# Patient Record
Sex: Female | Born: 1950
Health system: Southern US, Community
[De-identification: ages and names within clinical notes are randomized; demographics above are authoritative.]

## PROBLEM LIST (undated history)

## (undated) DIAGNOSIS — I10 Essential (primary) hypertension: Secondary | ICD-10-CM

## (undated) DIAGNOSIS — M1990 Unspecified osteoarthritis, unspecified site: Secondary | ICD-10-CM

## (undated) DIAGNOSIS — S2239XA Fracture of one rib, unspecified side, initial encounter for closed fracture: Secondary | ICD-10-CM

## (undated) DIAGNOSIS — S22009A Unspecified fracture of unspecified thoracic vertebra, initial encounter for closed fracture: Secondary | ICD-10-CM

## (undated) DIAGNOSIS — I609 Nontraumatic subarachnoid hemorrhage, unspecified: Secondary | ICD-10-CM

## (undated) DIAGNOSIS — T7840XA Allergy, unspecified, initial encounter: Secondary | ICD-10-CM

## (undated) DIAGNOSIS — E785 Hyperlipidemia, unspecified: Secondary | ICD-10-CM

## (undated) DIAGNOSIS — S06309A Unspecified focal traumatic brain injury with loss of consciousness of unspecified duration, initial encounter: Secondary | ICD-10-CM

## (undated) DIAGNOSIS — H04123 Dry eye syndrome of bilateral lacrimal glands: Secondary | ICD-10-CM

## (undated) DIAGNOSIS — S83419A Sprain of medial collateral ligament of unspecified knee, initial encounter: Secondary | ICD-10-CM

## (undated) DIAGNOSIS — D72829 Elevated white blood cell count, unspecified: Secondary | ICD-10-CM

## (undated) DIAGNOSIS — C801 Malignant (primary) neoplasm, unspecified: Secondary | ICD-10-CM

## (undated) DIAGNOSIS — S42123A Displaced fracture of acromial process, unspecified shoulder, initial encounter for closed fracture: Secondary | ICD-10-CM

## (undated) DIAGNOSIS — S36039A Unspecified laceration of spleen, initial encounter: Secondary | ICD-10-CM

## (undated) DIAGNOSIS — R011 Cardiac murmur, unspecified: Secondary | ICD-10-CM

## (undated) DIAGNOSIS — S300XXA Contusion of lower back and pelvis, initial encounter: Secondary | ICD-10-CM

## (undated) DIAGNOSIS — D62 Acute posthemorrhagic anemia: Secondary | ICD-10-CM

## (undated) DIAGNOSIS — S129XXA Fracture of neck, unspecified, initial encounter: Secondary | ICD-10-CM

## (undated) DIAGNOSIS — R188 Other ascites: Secondary | ICD-10-CM

## (undated) DIAGNOSIS — E876 Hypokalemia: Secondary | ICD-10-CM

## (undated) HISTORY — DX: Cardiac murmur, unspecified: R01.1

## (undated) HISTORY — PX: TUMOR EXCISION: SHX421

## (undated) HISTORY — DX: Dry eye syndrome of bilateral lacrimal glands: H04.123

## (undated) HISTORY — DX: Elevated white blood cell count, unspecified: D72.829

## (undated) HISTORY — DX: Allergy, unspecified, initial encounter: T78.40XA

## (undated) HISTORY — DX: Fracture of one rib, unspecified side, initial encounter for closed fracture: S22.39XA

## (undated) HISTORY — PX: TONSILLECTOMY: SUR1361

## (undated) HISTORY — DX: Sprain of medial collateral ligament of unspecified knee, initial encounter: S83.419A

## (undated) HISTORY — PX: BREAST BIOPSY: SHX20

## (undated) HISTORY — DX: Acute posthemorrhagic anemia: D62

## (undated) HISTORY — DX: Other ascites: R18.8

## (undated) HISTORY — DX: Unspecified laceration of spleen, initial encounter: S36.039A

## (undated) HISTORY — DX: Hypokalemia: E87.6

## (undated) HISTORY — DX: Displaced fracture of acromial process, unspecified shoulder, initial encounter for closed fracture: S42.123A

## (undated) HISTORY — DX: Fracture of neck, unspecified, initial encounter: S12.9XXA

## (undated) HISTORY — PX: NASAL SINUS SURGERY: SHX719

## (undated) HISTORY — DX: Contusion of lower back and pelvis, initial encounter: S30.0XXA

## (undated) HISTORY — PX: APPENDECTOMY: SHX54

## (undated) HISTORY — DX: Malignant (primary) neoplasm, unspecified: C80.1

## (undated) HISTORY — DX: Unspecified fracture of unspecified thoracic vertebra, initial encounter for closed fracture: S22.009A

## (undated) HISTORY — DX: Unspecified focal traumatic brain injury with loss of consciousness of unspecified duration, initial encounter: S06.309A

## (undated) HISTORY — PX: OTHER SURGICAL HISTORY: SHX169

## (undated) HISTORY — DX: Essential (primary) hypertension: I10

## (undated) HISTORY — DX: Unspecified osteoarthritis, unspecified site: M19.90

## (undated) HISTORY — DX: Nontraumatic subarachnoid hemorrhage, unspecified: I60.9

## (undated) HISTORY — DX: Hyperlipidemia, unspecified: E78.5

---

## 1981-05-30 HISTORY — PX: ABDOMINAL HYSTERECTOMY: SHX81

## 1999-09-28 ENCOUNTER — Other Ambulatory Visit: Admission: RE | Admit: 1999-09-28 | Discharge: 1999-09-28 | Payer: Self-pay | Admitting: Obstetrics and Gynecology

## 1999-10-16 ENCOUNTER — Ambulatory Visit (HOSPITAL_COMMUNITY): Admission: RE | Admit: 1999-10-16 | Discharge: 1999-10-16 | Payer: Self-pay | Admitting: Family Medicine

## 1999-10-16 ENCOUNTER — Encounter: Payer: Self-pay | Admitting: Family Medicine

## 2000-10-12 ENCOUNTER — Other Ambulatory Visit: Admission: RE | Admit: 2000-10-12 | Discharge: 2000-10-12 | Payer: Self-pay | Admitting: Obstetrics and Gynecology

## 2001-10-04 ENCOUNTER — Other Ambulatory Visit: Admission: RE | Admit: 2001-10-04 | Discharge: 2001-10-04 | Payer: Self-pay | Admitting: Gynecology

## 2002-10-07 ENCOUNTER — Other Ambulatory Visit: Admission: RE | Admit: 2002-10-07 | Discharge: 2002-10-07 | Payer: Self-pay | Admitting: Gynecology

## 2003-10-13 ENCOUNTER — Other Ambulatory Visit: Admission: RE | Admit: 2003-10-13 | Discharge: 2003-10-13 | Payer: Self-pay | Admitting: Gynecology

## 2004-10-12 ENCOUNTER — Other Ambulatory Visit: Admission: RE | Admit: 2004-10-12 | Discharge: 2004-10-12 | Payer: Self-pay | Admitting: Gynecology

## 2005-11-01 ENCOUNTER — Other Ambulatory Visit: Admission: RE | Admit: 2005-11-01 | Discharge: 2005-11-01 | Payer: Self-pay | Admitting: Gynecology

## 2006-11-02 ENCOUNTER — Other Ambulatory Visit: Admission: RE | Admit: 2006-11-02 | Discharge: 2006-11-02 | Payer: Self-pay | Admitting: Gynecology

## 2009-04-16 ENCOUNTER — Encounter (INDEPENDENT_AMBULATORY_CARE_PROVIDER_SITE_OTHER): Payer: Self-pay | Admitting: *Deleted

## 2009-05-08 ENCOUNTER — Encounter (INDEPENDENT_AMBULATORY_CARE_PROVIDER_SITE_OTHER): Payer: Self-pay | Admitting: *Deleted

## 2009-05-11 ENCOUNTER — Ambulatory Visit: Payer: Self-pay | Admitting: Gastroenterology

## 2009-05-27 ENCOUNTER — Ambulatory Visit: Payer: Self-pay | Admitting: Gastroenterology

## 2012-12-24 ENCOUNTER — Encounter: Payer: Self-pay | Admitting: Family Medicine

## 2012-12-24 ENCOUNTER — Ambulatory Visit (INDEPENDENT_AMBULATORY_CARE_PROVIDER_SITE_OTHER): Admitting: Family Medicine

## 2012-12-24 VITALS — BP 164/70 | HR 65 | Temp 97.0°F | Ht 63.0 in | Wt 120.0 lb

## 2012-12-24 DIAGNOSIS — J069 Acute upper respiratory infection, unspecified: Secondary | ICD-10-CM

## 2012-12-24 MED ORDER — BENZONATATE 100 MG PO CAPS: 100.0000 mg | ORAL_CAPSULE | Freq: Two times a day (BID) | ORAL | Status: DC | PRN

## 2012-12-24 MED ORDER — AZITHROMYCIN 250 MG PO TABS: ORAL_TABLET | ORAL | Status: DC

## 2012-12-24 NOTE — Progress Notes (Signed)
  Subjective:    Patient ID: Pam Franklin, female    DOB: [birth year], 62 y.o.   MRN: [id number]  HPI This 62 y.o. female presents for evaluation of cough and sore throat for 3 days.  She states She woke up 3 days ago with sore throat and congestion.   Review of Systems C/o cough and sore throat. No chest pain, SOB, HA, dizziness, vision change, N/V, diarrhea, constipation, dysuria, urinary urgency or frequency, myalgias, arthralgias or rash.     Objective:   Physical Exam Vital signs noted  Well developed well nourished female.  HEENT - Head atraumatic Normocephalic                Eyes - PERRLA, Conjuctiva - clear Sclera- Clear EOMI                Ears - EAC's Wnl TM's Wnl Gross Hearing WNL                Nose - Nares patent                 Throat - oropharanx wnl Respiratory - Lungs CTA bilateral Cardiac - RRR S1 and S2 without murmur GI - Abdomen soft Nontender and bowel sounds active x 4       Assessment & Plan:  Acute upper respiratory infections of unspecified site - Plan: azithromycin (ZITHROMAX) 250 MG tablet, benzonatate (TESSALON) 100 MG capsule

## 2012-12-24 NOTE — Patient Instructions (Addendum)

## 2013-01-18 ENCOUNTER — Telehealth: Payer: Self-pay | Admitting: Family Medicine

## 2013-01-18 ENCOUNTER — Ambulatory Visit (INDEPENDENT_AMBULATORY_CARE_PROVIDER_SITE_OTHER): Admitting: Family Medicine

## 2013-01-18 ENCOUNTER — Encounter: Payer: Self-pay | Admitting: Family Medicine

## 2013-01-18 VITALS — BP 183/71 | HR 66 | Temp 97.2°F | Ht 63.0 in | Wt 119.5 lb

## 2013-01-18 DIAGNOSIS — I10 Essential (primary) hypertension: Secondary | ICD-10-CM

## 2013-01-18 DIAGNOSIS — R350 Frequency of micturition: Secondary | ICD-10-CM

## 2013-01-18 DIAGNOSIS — R42 Dizziness and giddiness: Secondary | ICD-10-CM

## 2013-01-18 LAB — POCT URINALYSIS DIPSTICK
Bilirubin, UA: NEGATIVE
Glucose, UA: NEGATIVE
Ketones, UA: NEGATIVE
Leukocytes, UA: NEGATIVE
Nitrite, UA: NEGATIVE
Protein, UA: NEGATIVE
Spec Grav, UA: <=1.005
Urobilinogen, UA: NEGATIVE
pH, UA: 8

## 2013-01-18 LAB — POCT UA - MICROSCOPIC ONLY
Casts, Ur, LPF, POC: NEGATIVE
Crystals, Ur, HPF, POC: NEGATIVE
Epithelial cells, urine per micros: NEGATIVE
Mucus, UA: NEGATIVE
WBC, Ur, HPF, POC: NEGATIVE
Yeast, UA: NEGATIVE

## 2013-01-18 MED ORDER — LISINOPRIL 10 MG PO TABS: 10.0000 mg | ORAL_TABLET | Freq: Every day | ORAL | Status: DC

## 2013-01-18 NOTE — Progress Notes (Signed)
  Subjective:    Patient ID: Pam Franklin, female    DOB: 1951-01-29, 62 y.o.   MRN: [id number]  HPI  This 62 y.o. female presents for evaluation of vertigo sx's, right bicep discomfort, elevated bp, and  Fatigue.  She is having some vertigo sx's when she got up this am.  She was nauseated but this has resolved.  Review of Systems C/o vertigo and fatigue.   No chest pain, SOB, HA, dizziness, vision change, N/V, diarrhea, constipation, dysuria, urinary urgency or frequency, myalgias, arthralgias or rash.  Objective:   Physical Exam Vital signs noted  Well developed well nourished female.  HEENT - Head atraumatic Normocephalic                Eyes - PERRLA, Conjuctiva - clear Sclera- Clear EOM - with nystagmus and c/o vertigo                Ears - EAC's Wnl TM's Wnl Gross Hearing WNL                Nose - Nares patent                 Throat - oropharanx wnl Respiratory - Lungs CTA bilateral Cardiac - RRR S1 and S2 without murmur GI - Abdomen soft Nontender and bowel sounds active x 4 Extremities - No edema. Neuro - Grossly intact. MS - TTP right bicep  EKG - NSR without acute ST-T changes.     Results for orders placed in visit on 01/18/13  POCT URINALYSIS DIPSTICK      Result Value Range   Color, UA strew     Clarity, UA clear     Glucose, UA neg     Bilirubin, UA neg     Ketones, UA neg     Spec Grav, UA <=1.005     Blood, UA trace     pH, UA 8.0     Protein, UA neg     Urobilinogen, UA negative     Nitrite, UA neg     Leukocytes, UA Negative    POCT UA - MICROSCOPIC ONLY      Result Value Range   WBC, Ur, HPF, POC neg     RBC, urine, microscopic occ     Bacteria, U Microscopic mod     Mucus, UA neg     Epithelial cells, urine per micros neg     Crystals, Ur, HPF, POC neg     Casts, Ur, LPF, POC neg     Yeast, UA neg     Assessment & Plan:  Dizziness - Plan: EKG 12-Lead, Thyroid Panel With TSH.  Discussed this is middle ear, she declines Meclizine or  antivert, recommend she use flonase 2 sprays per nostril qd.  She has flonase at home.  Essential hypertension, benign - Plan: lisinopril (PRINIVIL,ZESTRIL) 10 MG tablet, POCT CBC, CMP14+EGFR, Thyroid Panel With TSH, Lipid panel.  Start Lisinopril 10mg  and follow up in 2 weeks.  Urinary frequency - Plan: POCT urinalysis dipstick, POCT UA - Microscopic Only.  UA is negative for UTI.  Right bicep tendonitis - Aleve otc prn  Follow up in 2 weeks.

## 2013-01-18 NOTE — Patient Instructions (Signed)

## 2013-01-18 NOTE — Telephone Encounter (Signed)
appt made

## 2013-01-19 LAB — LIPID PANEL
Chol/HDL Ratio: 2.9 ratio units (ref 0.0–4.4)
Cholesterol, Total: 228 mg/dL — ABNORMAL HIGH (ref 100–199)
HDL: 78 mg/dL (ref >39)
LDL Calculated: 135 mg/dL — ABNORMAL HIGH (ref 0–99)
Triglycerides: 74 mg/dL (ref 0–149)
VLDL Cholesterol Cal: 15 mg/dL (ref 5–40)

## 2013-01-19 LAB — CMP14+EGFR
ALT: 10 IU/L (ref 0–32)
AST: 14 IU/L (ref 0–40)
Albumin/Globulin Ratio: 2 (ref 1.1–2.5)
Albumin: 4.7 g/dL (ref 3.6–4.8)
Alkaline Phosphatase: 87 IU/L (ref 39–117)
BUN/Creatinine Ratio: 17 (ref 11–26)
BUN: 15 mg/dL (ref 8–27)
CO2: 26 mmol/L (ref 18–29)
Calcium: 9.9 mg/dL (ref 8.6–10.2)
Chloride: 101 mmol/L (ref 97–108)
Creatinine, Ser: 0.87 mg/dL (ref 0.57–1.00)
GFR calc Af Amer: 81 mL/min/{1.73_m2} (ref >59)
GFR calc non Af Amer: 71 mL/min/{1.73_m2} (ref >59)
Globulin, Total: 2.3 g/dL (ref 1.5–4.5)
Glucose: 106 mg/dL — ABNORMAL HIGH (ref 65–99)
Potassium: 4.2 mmol/L (ref 3.5–5.2)
Sodium: 141 mmol/L (ref 134–144)
Total Bilirubin: 0.5 mg/dL (ref 0.0–1.2)
Total Protein: 7 g/dL (ref 6.0–8.5)

## 2013-01-19 LAB — THYROID PANEL WITH TSH
Free Thyroxine Index: 2.9 (ref 1.2–4.9)
T3 Uptake Ratio: 30 % (ref 24–39)
T4, Total: 9.7 ug/dL (ref 4.5–12.0)
TSH: 1.15 u[IU]/mL (ref 0.450–4.500)

## 2013-02-01 ENCOUNTER — Ambulatory Visit (INDEPENDENT_AMBULATORY_CARE_PROVIDER_SITE_OTHER): Admitting: Family Medicine

## 2013-02-01 ENCOUNTER — Encounter: Payer: Self-pay | Admitting: Family Medicine

## 2013-02-01 VITALS — BP 137/74 | HR 57 | Temp 96.7°F | Ht 63.0 in | Wt 119.2 lb

## 2013-02-01 DIAGNOSIS — I10 Essential (primary) hypertension: Secondary | ICD-10-CM

## 2013-02-01 DIAGNOSIS — E785 Hyperlipidemia, unspecified: Secondary | ICD-10-CM

## 2013-02-01 HISTORY — DX: Essential (primary) hypertension: I10

## 2013-02-01 HISTORY — DX: Hyperlipidemia, unspecified: E78.5

## 2013-02-01 NOTE — Progress Notes (Signed)
  Subjective:    Patient ID: Pam Franklin, female    DOB: 1951/05/18, 62 y.o.   MRN: [id number]  HPI This 62 y.o. female presents for evaluation of right biceps tendonitis and hypertension. She was started on lisinopril 10mg  po qd and her bp is a lot better.  She is taking Aleve otc and this is helping her right biceps tendonitis.  She had labs last visit And her LDL was 135 and HDL was 78. .   Review of Systems No chest pain, SOB, HA, dizziness, vision change, N/V, diarrhea, constipation, dysuria, urinary urgency or frequency, myalgias, arthralgias or rash.     Objective:   Physical Exam Vital signs noted  Well developed well nourished female.  HEENT - Head atraumatic Normocephalic                Eyes - PERRLA, Conjuctiva - clear Sclera- Clear EOMI                Ears - EAC's Wnl TM's Wnl Gross Hearing WNL                Nose - Nares patent                 Throat - oropharanx wnl Respiratory - Lungs CTA bilateral Cardiac - RRR S1 and S2 without murmur.       Assessment & Plan:  Essential hypertension, benign - Controlled and follow up in 6 months.  Other and unspecified hyperlipidemia - Continue to eat low fat and cholesterol diet and discussed with Patient since her HDL is high this off sets the elevated HDL.

## 2013-02-01 NOTE — Patient Instructions (Signed)

## 2013-02-24 DIAGNOSIS — S06309A Unspecified focal traumatic brain injury with loss of consciousness of unspecified duration, initial encounter: Secondary | ICD-10-CM

## 2013-02-24 DIAGNOSIS — I609 Nontraumatic subarachnoid hemorrhage, unspecified: Secondary | ICD-10-CM | POA: Insufficient documentation

## 2013-02-24 DIAGNOSIS — S0630AA Unspecified focal traumatic brain injury with loss of consciousness status unknown, initial encounter: Secondary | ICD-10-CM

## 2013-02-24 DIAGNOSIS — S22009A Unspecified fracture of unspecified thoracic vertebra, initial encounter for closed fracture: Secondary | ICD-10-CM

## 2013-02-24 DIAGNOSIS — E876 Hypokalemia: Secondary | ICD-10-CM

## 2013-02-24 DIAGNOSIS — D62 Acute posthemorrhagic anemia: Secondary | ICD-10-CM

## 2013-02-24 DIAGNOSIS — S129XXA Fracture of neck, unspecified, initial encounter: Secondary | ICD-10-CM

## 2013-02-24 DIAGNOSIS — D72829 Elevated white blood cell count, unspecified: Secondary | ICD-10-CM

## 2013-02-24 DIAGNOSIS — S2239XA Fracture of one rib, unspecified side, initial encounter for closed fracture: Secondary | ICD-10-CM | POA: Insufficient documentation

## 2013-02-24 HISTORY — DX: Fracture of one rib, unspecified side, initial encounter for closed fracture: S22.39XA

## 2013-02-24 HISTORY — DX: Rider (driver) (passenger) of other motorcycle injured in unspecified traffic accident, initial encounter: V29.99XA

## 2013-02-24 HISTORY — DX: Hypokalemia: E87.6

## 2013-02-24 HISTORY — DX: Elevated white blood cell count, unspecified: D72.829

## 2013-02-24 HISTORY — DX: Nontraumatic subarachnoid hemorrhage, unspecified: I60.9

## 2013-02-24 HISTORY — DX: Acute posthemorrhagic anemia: D62

## 2013-02-24 HISTORY — DX: Unspecified focal traumatic brain injury with loss of consciousness status unknown, initial encounter: S06.30AA

## 2013-02-24 HISTORY — DX: Fracture of neck, unspecified, initial encounter: S12.9XXA

## 2013-02-24 HISTORY — DX: Unspecified fracture of unspecified thoracic vertebra, initial encounter for closed fracture: S22.009A

## 2013-02-24 HISTORY — DX: Unspecified focal traumatic brain injury with loss of consciousness of unspecified duration, initial encounter: S06.309A

## 2013-02-25 DIAGNOSIS — S36039A Unspecified laceration of spleen, initial encounter: Secondary | ICD-10-CM

## 2013-02-25 DIAGNOSIS — S300XXA Contusion of lower back and pelvis, initial encounter: Secondary | ICD-10-CM

## 2013-02-25 DIAGNOSIS — R188 Other ascites: Secondary | ICD-10-CM | POA: Insufficient documentation

## 2013-02-25 HISTORY — DX: Contusion of lower back and pelvis, initial encounter: S30.0XXA

## 2013-02-25 HISTORY — DX: Unspecified laceration of spleen, initial encounter: S36.039A

## 2013-02-25 HISTORY — DX: Other ascites: R18.8

## 2013-03-18 ENCOUNTER — Ambulatory Visit (INDEPENDENT_AMBULATORY_CARE_PROVIDER_SITE_OTHER): Admitting: Family Medicine

## 2013-03-18 ENCOUNTER — Encounter (INDEPENDENT_AMBULATORY_CARE_PROVIDER_SITE_OTHER): Payer: Self-pay

## 2013-03-18 VITALS — BP 155/80 | HR 97 | Temp 97.1°F | Wt 113.4 lb

## 2013-03-18 DIAGNOSIS — R7309 Other abnormal glucose: Secondary | ICD-10-CM

## 2013-03-18 DIAGNOSIS — Z09 Encounter for follow-up examination after completed treatment for conditions other than malignant neoplasm: Secondary | ICD-10-CM

## 2013-03-18 DIAGNOSIS — E875 Hyperkalemia: Secondary | ICD-10-CM

## 2013-03-18 LAB — POCT CBC
Granulocyte percent: 56.3 %G (ref 37–80)
HCT, POC: 38.8 % (ref 37.7–47.9)
Hemoglobin: 12.8 g/dL (ref 12.2–16.2)
Lymph, poc: 2.1 (ref 0.6–3.4)
MCH, POC: 30.4 pg (ref 27–31.2)
MCHC: 33.1 g/dL (ref 31.8–35.4)
MCV: 91.9 fL (ref 80–97)
MPV: 8.1 fL (ref 0–99.8)
POC Granulocyte: 3 (ref 2–6.9)
POC LYMPH PERCENT: 38.4 %L (ref 10–50)
Platelet Count, POC: 489 10*3/uL — AB (ref 142–424)
RBC: 4.2 M/uL (ref 4.04–5.48)
RDW, POC: 16.3 %
WBC: 5.4 10*3/uL (ref 4.6–10.2)
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
## 2013-03-18 NOTE — Patient Instructions (Signed)
Concussion and Brain Injury  A blow or jolt to the head can disrupt the normal function of the brain. This type of brain injury is often called a "concussion" or a "closed head injury." Concussions are usually not life-threatening. Even so, the effects of a concussion can be serious.   CAUSES   A concussion is caused by a blunt blow to the head. The blow might be direct or indirect as described below.  · Direct blow (running into another player during a soccer game, being hit in a fight, or hitting your head on a hard surface).  · Indirect blow (when your head moves rapidly and violently back and forth like in a car crash).  SYMPTOMS   The brain is very complex. Every head injury is different. Some symptoms may appear right away. Other symptoms may not show up for days or weeks after the concussion. The signs of concussion can be hard to notice. Early on, problems may be missed by patients, family members, and caregivers. You may look fine even though you are acting or feeling differently.   These symptoms are usually temporary, but may last for days, weeks, or even longer. Symptoms include:  · Mild headaches that will not go away.  · Having more trouble than usual with:  · Remembering things.  · Paying attention or concentrating.  · Organizing daily tasks.  · Making decisions and solving problems.  · Slowness in thinking, acting, speaking, or reading.  · Getting lost or easily confused.  · Feeling tired all the time or lacking energy (fatigue).  · Feeling drowsy.  · Sleep disturbances.  · Sleeping more than usual.  · Sleeping less than usual.  · Trouble falling asleep.  · Trouble sleeping (insomnia).  · Loss of balance or feeling lightheaded or dizzy.  · Nausea or vomiting.  · Numbness or tingling.  · Increased sensitivity to:  · Sounds.  · Lights.  · Distractions.  Other symptoms might include:  · Vision problems or eyes that tire easily.  · Diminished sense of taste or smell.  · Ringing in the ears.  · Mood  changes such as feeling sad, anxious, or listless.  · Becoming easily irritated or angry for little or no reason.  · Lack of motivation.  DIAGNOSIS   Your caregiver can usually diagnose a concussion or mild brain injury based on your description of your injury and your symptoms.   Your evaluation might include:  · A brain scan to look for signs of injury to the brain. Even if the test shows no injury, you may still have a concussion.  · Blood tests to be sure other problems are not present.  TREATMENT   · People with a concussion need to be examined and evaluated. Most people with concussions are treated in an emergency department, urgent care, or clinic. Some people must stay in the hospital overnight for further treatment.  · Your caregiver will send you home with important instructions to follow. Be sure to carefully follow them.  · Tell your caregiver if you are already taking any medicines (prescription, over-the-counter, or natural remedies), or if you are drinking alcohol or taking illegal drugs. Also, talk with your caregiver if you are taking blood thinners (anticoagulants) or aspirin. These drugs may increase your chances of complications. All of this is important information that may affect treatment.  · Only take over-the-counter or prescription medicines for pain, discomfort, or fever as directed by your caregiver.  PROGNOSIS     How fast people recover from brain injury varies from person to person. Although most people have a good recovery, how quickly they improve depends on many factors. These factors include how severe their concussion was, what part of the brain was injured, their age, and how healthy they were before the concussion.   Because all head injuries are different, so is recovery. Most people with mild injuries recover fully. Recovery can take time. In general, recovery is slower in older persons. Also, persons who have had a concussion in the past or have other medical problems may find  that it takes longer to recover from their current injury. Anxiety and depression may also make it harder to adjust to the symptoms of brain injury.  HOME CARE INSTRUCTIONS   Return to your normal activities slowly, not all at once. You must give your body and brain enough time for recovery.  · Get plenty of sleep at night, and rest during the day. Rest helps the brain to heal.  · Avoid staying up late at night.  · Keep the same bedtime hours on weekends and weekdays.  · Take daytime naps or rest breaks when you feel tired.  · Limit activities that require a lot of thought or concentration (brain or cognitive rest). This includes:  · Homework or job-related work.  · Watching TV.  · Computer work.  · Avoid activities that could lead to a second brain injury, such as contact or recreational sports, until your caregiver says it is okay. Even after your brain injury has healed, you should protect yourself from having another concussion.  · Ask your caregiver when you can return to your normal activities such as driving, bicycling, or operating heavy equipment. Your ability to react may be slower after a brain injury.  · Talk with your caregiver about when you can return to work or school.  · Inform your teachers, school nurse, school counselor, coach, athletic trainer, or work manager about your injury, symptoms, and restrictions. They should be instructed to report:  · Increased problems with attention or concentration.  · Increased problems remembering or learning new information.  · Increased time needed to complete tasks or assignments.  · Increased irritability or decreased ability to cope with stress.  · Increased symptoms.  · Take only those medicines that your caregiver has approved.  · Do not drink alcohol until your caregiver says you are well enough to do so. Alcohol and certain other drugs may slow your recovery and can put you at risk of further injury.  · If it is harder than usual to remember things,  write them down.  · If you are easily distracted, try to do one thing at a time. For example, do not try to watch TV while fixing dinner.  · Talk with family members or close friends when making important decisions.  · Keep all follow-up appointments. Repeated evaluation of your symptoms is recommended for your recovery.  PREVENTION   Protect your head from future injury. It is very important to avoid another head or brain injury before you have recovered. In rare cases, another injury has lead to permanent brain damage, brain swelling, or death. Avoid injuries by using:  · Seatbelts when riding in a car.  · Alcohol only in moderation.  · A helmet when biking, skiing, skateboarding, skating, or doing similar activities.  · Safety measures in your home.  · Remove clutter and tripping hazards from floors and stairways.  · Use grab   bars in bathrooms and handrails by stairs.  · Place non-slip mats on floors and in bathtubs.  · Improve lighting in dim areas.  SEEK MEDICAL CARE IF:   A head injury can cause lingering symptoms. You should seek medical care if you have any of the following symptoms for more than 3 weeks after your injury or are planning to return to sports:  · Chronic headaches.  · Dizziness or balance problems.  · Nausea.  · Vision problems.  · Increased sensitivity to noise or light.  · Depression or mood swings.  · Anxiety or irritability.  · Memory problems.  · Difficulty concentrating or paying attention.  · Sleep problems.  · Feeling tired all the time.  SEEK IMMEDIATE MEDICAL CARE IF:   You have had a blow or jolt to the head and you (or your family or friends) notice:  · Severe or worsening headaches.  · Weakness (even if only in one hand or one leg or one part of the face), numbness, or decreased coordination.  · Repeated vomiting.  · Increased sleepiness or passing out.  · One black center of the eye (pupil) is larger than the other.  · Convulsions (seizures).  · Slurred speech.  · Increasing  confusion, restlessness, agitation, or irritability.  · Lack of ability to recognize people or places.  · Neck pain.  · Difficulty being awakened.  · Unusual behavior changes.  · Loss of consciousness.  Older adults with a brain injury may have a higher risk of serious complications such as a blood clot on the brain. Headaches that get worse or an increase in confusion are signs of this complication. If these signs occur, see a caregiver right away.  MAKE SURE YOU:   · Understand these instructions.  · Will watch your condition.  · Will get help right away if you are not doing well or get worse.  FOR MORE INFORMATION   Several groups help people with brain injury and their families. They provide information and put people in touch with local resources. These include support groups, rehabilitation services, and a variety of health care professionals. Among these groups, the Brain Injury Association (BIA, www.biausa.org) has a national office that gathers scientific and educational information and works on a national level to help people with brain injury.   Document Released: 08/06/2003 Document Revised: 08/08/2011 Document Reviewed: 01/02/2008  [employer]® Patient Information ©2014 [employer].

## 2013-03-19 LAB — CMP14+EGFR
ALT: 9 IU/L (ref 0–32)
AST: 24 IU/L (ref 0–40)
Albumin/Globulin Ratio: 1.8 (ref 1.1–2.5)
Albumin: 4.6 g/dL (ref 3.6–4.8)
Alkaline Phosphatase: 113 IU/L (ref 39–117)
BUN/Creatinine Ratio: 22 (ref 11–26)
BUN: 14 mg/dL (ref 8–27)
CO2: 20 mmol/L (ref 18–29)
Calcium: 10.4 mg/dL — ABNORMAL HIGH (ref 8.6–10.2)
Chloride: 103 mmol/L (ref 97–108)
Creatinine, Ser: 0.63 mg/dL (ref 0.57–1.00)
GFR calc Af Amer: 110 mL/min/{1.73_m2} (ref >59)
GFR calc non Af Amer: 95 mL/min/{1.73_m2} (ref >59)
Globulin, Total: 2.5 g/dL (ref 1.5–4.5)
Glucose: 114 mg/dL — ABNORMAL HIGH (ref 65–99)
Potassium: 5.3 mmol/L — ABNORMAL HIGH (ref 3.5–5.2)
Sodium: 142 mmol/L (ref 134–144)
Total Bilirubin: 0.7 mg/dL (ref 0.0–1.2)
Total Protein: 7.1 g/dL (ref 6.0–8.5)

## 2013-03-26 NOTE — Addendum Note (Signed)
Addended by: Magdalene River on: 03/26/2013 02:06 PM   Modules accepted: Orders

## 2013-03-27 ENCOUNTER — Other Ambulatory Visit (INDEPENDENT_AMBULATORY_CARE_PROVIDER_SITE_OTHER)

## 2013-03-27 DIAGNOSIS — Z09 Encounter for follow-up examination after completed treatment for conditions other than malignant neoplasm: Secondary | ICD-10-CM

## 2013-03-27 DIAGNOSIS — E875 Hyperkalemia: Secondary | ICD-10-CM

## 2013-03-27 NOTE — Progress Notes (Signed)
Pt came in for labs only 

## 2013-03-28 LAB — BMP8+EGFR
BUN/Creatinine Ratio: 21 (ref 11–26)
BUN: 15 mg/dL (ref 8–27)
CO2: 23 mmol/L (ref 18–29)
Calcium: 9.9 mg/dL (ref 8.6–10.2)
Chloride: 102 mmol/L (ref 97–108)
Creatinine, Ser: 0.72 mg/dL (ref 0.57–1.00)
GFR calc Af Amer: 102 mL/min/{1.73_m2} (ref >59)
GFR calc non Af Amer: 89 mL/min/{1.73_m2} (ref >59)
Glucose: 101 mg/dL — ABNORMAL HIGH (ref 65–99)
Potassium: 4.4 mmol/L (ref 3.5–5.2)
Sodium: 142 mmol/L (ref 134–144)

## 2013-04-05 DIAGNOSIS — S83419A Sprain of medial collateral ligament of unspecified knee, initial encounter: Secondary | ICD-10-CM

## 2013-04-05 DIAGNOSIS — S42123A Displaced fracture of acromial process, unspecified shoulder, initial encounter for closed fracture: Secondary | ICD-10-CM

## 2013-04-05 HISTORY — DX: Displaced fracture of acromial process, unspecified shoulder, initial encounter for closed fracture: S42.123A

## 2013-04-05 HISTORY — DX: Sprain of medial collateral ligament of unspecified knee, initial encounter: S83.419A

## 2013-07-08 DIAGNOSIS — Z4789 Encounter for other orthopedic aftercare: Secondary | ICD-10-CM | POA: Diagnosis not present

## 2013-07-08 DIAGNOSIS — Z5189 Encounter for other specified aftercare: Secondary | ICD-10-CM | POA: Diagnosis not present

## 2013-07-08 DIAGNOSIS — M25569 Pain in unspecified knee: Secondary | ICD-10-CM | POA: Diagnosis not present

## 2013-07-08 DIAGNOSIS — S42309D Unspecified fracture of shaft of humerus, unspecified arm, subsequent encounter for fracture with routine healing: Secondary | ICD-10-CM | POA: Diagnosis not present

## 2013-07-08 DIAGNOSIS — M25519 Pain in unspecified shoulder: Secondary | ICD-10-CM | POA: Diagnosis not present

## 2013-07-09 DIAGNOSIS — L821 Other seborrheic keratosis: Secondary | ICD-10-CM | POA: Diagnosis not present

## 2013-07-09 DIAGNOSIS — L820 Inflamed seborrheic keratosis: Secondary | ICD-10-CM | POA: Diagnosis not present

## 2013-07-09 DIAGNOSIS — L719 Rosacea, unspecified: Secondary | ICD-10-CM | POA: Diagnosis not present

## 2013-07-09 DIAGNOSIS — R209 Unspecified disturbances of skin sensation: Secondary | ICD-10-CM | POA: Diagnosis not present

## 2013-08-19 ENCOUNTER — Encounter: Payer: Self-pay | Admitting: Family Medicine

## 2013-08-19 ENCOUNTER — Ambulatory Visit (INDEPENDENT_AMBULATORY_CARE_PROVIDER_SITE_OTHER): Payer: Medicare Other

## 2013-08-19 ENCOUNTER — Ambulatory Visit (INDEPENDENT_AMBULATORY_CARE_PROVIDER_SITE_OTHER): Payer: Medicare Other | Admitting: Family Medicine

## 2013-08-19 VITALS — BP 134/74 | HR 85 | Temp 96.8°F | Ht 63.0 in | Wt 111.0 lb

## 2013-08-19 DIAGNOSIS — R1030 Lower abdominal pain, unspecified: Secondary | ICD-10-CM

## 2013-08-19 DIAGNOSIS — R011 Cardiac murmur, unspecified: Secondary | ICD-10-CM

## 2013-08-19 DIAGNOSIS — N200 Calculus of kidney: Secondary | ICD-10-CM | POA: Diagnosis not present

## 2013-08-19 DIAGNOSIS — R109 Unspecified abdominal pain: Secondary | ICD-10-CM

## 2013-08-19 DIAGNOSIS — R252 Cramp and spasm: Secondary | ICD-10-CM | POA: Diagnosis not present

## 2013-08-19 LAB — POCT UA - MICROSCOPIC ONLY
Bacteria, U Microscopic: NEGATIVE
Casts, Ur, LPF, POC: NEGATIVE
Crystals, Ur, HPF, POC: NEGATIVE
Mucus, UA: NEGATIVE
Yeast, UA: NEGATIVE

## 2013-08-19 LAB — POCT URINALYSIS DIPSTICK
Bilirubin, UA: NEGATIVE
Glucose, UA: NEGATIVE
Ketones, UA: NEGATIVE
Nitrite, UA: NEGATIVE
Protein, UA: NEGATIVE
Spec Grav, UA: 1.01
Urobilinogen, UA: NEGATIVE
pH, UA: 6

## 2013-08-19 MED ORDER — HYDROCODONE-ACETAMINOPHEN 5-325 MG PO TABS: 1.0000 | ORAL_TABLET | Freq: Four times a day (QID) | ORAL | Status: DC | PRN

## 2013-08-19 NOTE — Progress Notes (Signed)
   Subjective:    Patient ID: [name], female    DOB: 28-Apr-1951, 63 y.o.   MRN: [id number]  HPI This 63 y.o. female presents for evaluation of right back pain and flank pain.  She has hx Of kidney stones.  She is having colicky abdominal pain.  She has been having a lot of  Muscle cramps in her feet and legs when she gets up in the am.  She is having some heart palpitations and she has hx of murmur and does get SOB at times.  She is concerned her Heart valve may be causing this and would like to get this checked out.   Review of Systems C/o right flank pain, heart murmur, sob, palpitations, and muscle cramps. No chest pain, HA, dizziness, vision change, N/V, diarrhea, constipation, dysuria, urinary urgency or frequency or rash.     Objective:   Physical Exam Vital signs noted  Well developed well nourished female.  HEENT - Head atraumatic Normocephalic                Eyes - PERRLA, Conjuctiva - clear Sclera- Clear EOMI                Ears - EAC's Wnl TM's Wnl Gross Hearing WNL                 Throat - oropharanx wnl Respiratory - Lungs CTA bilateral Cardiac - RRR S1 and S2 with systolic murmur 2/6 GI - Abdomen soft Nontender and bowel sounds active x 4 Extremities - No edema. Neuro - Grossly intact. MS - TTP right flank  Results for orders placed in visit on 08/19/13  POCT UA - MICROSCOPIC ONLY      Result Value Ref Range   WBC, Ur, HPF, POC 1-5     RBC, urine, microscopic 1-5     Bacteria, U Microscopic neg     Mucus, UA neg     Epithelial cells, urine per micros occ     Crystals, Ur, HPF, POC neg     Casts, Ur, LPF, POC neg     Yeast, UA neg    POCT URINALYSIS DIPSTICK      Result Value Ref Range   Color, UA yellow     Clarity, UA clear     Glucose, UA neg     Bilirubin, UA neg     Ketones, UA neg     Spec Grav, UA 1.010     Blood, UA trace     pH, UA 6.0     Protein, UA neg     Urobilinogen, UA negative     Nitrite, UA neg     Leukocytes, UA Trace        Kub - possible kidney stone right side Lysbeth Penner FNP    Assessment & Plan:  Lower abdominal pain - Plan: DG Abd 1 View, POCT UA - Microscopic Only, POCT urinalysis dipstick.  Norco 5mg  one po qid prn pain.  Kidney stones - Push po fluids, if unable to pass after 3 or 4 days then consider referral and CT scan  Flank pain - Plan: DG Abd 1 View, POCT UA - Microscopic Only, POCT urinalysis dipstick  Cramps - Try otc tonic water  Heart murmur - Referral to Cardiology  Lysbeth Penner FNP

## 2013-08-19 NOTE — Patient Instructions (Signed)
Heart Murmur A heart murmur is an extra sound heard by your health care provider when listening to your heart with a device called a stethoscope. The sound comes from turbulence when blood flows through the heart and may be a "hum" or "whoosh" sound heard when the heart beats. There are two types of heart murmurs:  Innocent murmurs Most people with this type of heart murmur do not have a heart problem. Many children have innocent heart murmurs. Your health care provider may suggest some basic testing to know whether your murmur is an innocent murmur. If an innocent heart murmur is found, there is no need for further tests or treatment and no need to restrict activities or stop playing sports.  Abnormal murmurs These types of murmurs can occur in children and adults. In children, abnormal heart murmurs are typically caused from heart defects that are present at birth (congenital). In adults, abnormal murmurs are usually from heart valve problems caused by disease, infection, or aging. CAUSES  All heart murmurs are a result of an issue with your heart valves. Normally, these valves open to let blood flow through or out of your heart and then shut to keep it from flowing backward. If they do not work properly, you could have:  Regurgitation When blood leaks back through the valve in the wrong direction.  Mitral valve prolapse When the mitral valve of the heart has a loose flap and does not close tightly.  Stenosis When the valve does not open enough and blocks blood flow. SIGNS AND SYMPTOMS  Innocent murmurs do not cause symptoms, and many people with abnormal murmurs may or may not have symptoms. If symptoms do develop, they may include:  Shortness of breath.  Blue coloring of the skin, especially on the fingertips.  Chest pain.  Palpitations, or feeling a fluttering or skipped heartbeat.  Fainting.  Persistent cough.  Getting tired much faster than expected. DIAGNOSIS  A heart murmur  might be heard during a sports physical or during any type of examination. When a murmur is heard, it may suggest a possible problem. When this happens, your health care provider may ask you to see a heart specialist (cardiologist). You may also be asked to have one or more heart tests. In these cases, testing may vary depending on what your health care provider heard. Tests for a heart murmur may include:  Electrocardiogram.  Echocardiogram.  MRI. For children and adults who have an abnormal heart murmur and want to play sports, it is important to complete testing, review test results, and receive recommendations from your health care provider. If heart disease is present, it may not be safe to play. TREATMENT  Innocent murmurs require no treatment or activity restriction. If an abnormal murmur represents a problem with the heart, treatment will depend on the exact nature of the problem. In these cases, medicine or surgery may be needed to treat the problem. HOME CARE INSTRUCTIONS If you want to participate in sports or other types of strenuous physical activity, it is important to discuss this first with your health care provider. If the murmur represents a problem with the heart and you choose to participate in sports, there is a small chance that a serious problem (including sudden death) could result.  SEEK MEDICAL CARE IF:   You feel that your symptoms are slowly worsening.  You develop any new symptoms that cause concern.  You feel that you are having side effects from any medicines prescribed. SEEK IMMEDIATE  MEDICAL CARE IF:   You develop chest pain.  You have shortness of breath.  You notice that your heart beats irregularly often enough to cause you to worry.  You have fainting spells.  Your symptoms suddenly get worse. Document Released: 06/23/2004 Document Revised: 03/06/2013 Document Reviewed: 01/21/2013 [facility] Patient Information 2014 [place].

## 2013-08-20 LAB — URINE CULTURE

## 2013-09-05 DIAGNOSIS — L821 Other seborrheic keratosis: Secondary | ICD-10-CM | POA: Diagnosis not present

## 2013-09-05 DIAGNOSIS — L820 Inflamed seborrheic keratosis: Secondary | ICD-10-CM | POA: Diagnosis not present

## 2013-09-05 DIAGNOSIS — L570 Actinic keratosis: Secondary | ICD-10-CM | POA: Diagnosis not present

## 2013-09-05 DIAGNOSIS — L723 Sebaceous cyst: Secondary | ICD-10-CM | POA: Diagnosis not present

## 2013-09-15 DIAGNOSIS — L0291 Cutaneous abscess, unspecified: Secondary | ICD-10-CM | POA: Diagnosis not present

## 2013-09-15 DIAGNOSIS — L0390 Cellulitis, unspecified: Secondary | ICD-10-CM | POA: Diagnosis not present

## 2013-10-10 ENCOUNTER — Encounter: Payer: Self-pay | Admitting: *Deleted

## 2013-10-18 ENCOUNTER — Encounter: Payer: Self-pay | Admitting: *Deleted

## 2013-10-18 ENCOUNTER — Encounter: Payer: Self-pay | Admitting: Cardiology

## 2013-10-18 ENCOUNTER — Ambulatory Visit (INDEPENDENT_AMBULATORY_CARE_PROVIDER_SITE_OTHER): Payer: Medicare Other | Admitting: Cardiology

## 2013-10-18 VITALS — BP 182/82 | HR 72 | Ht 63.0 in | Wt 113.0 lb

## 2013-10-18 DIAGNOSIS — I10 Essential (primary) hypertension: Secondary | ICD-10-CM

## 2013-10-18 DIAGNOSIS — R011 Cardiac murmur, unspecified: Secondary | ICD-10-CM

## 2013-10-18 NOTE — Patient Instructions (Signed)
Your physician has requested that you have an echocardiogram. Echocardiography is a painless test that uses sound waves to create images of your heart. It provides your doctor with information about the size and shape of your heart and how well your heart's chambers and valves are working. This procedure takes approximately one hour. There are no restrictions for this procedure.  Your physician has requested that you regularly monitor and record your blood pressure readings at home. Please use the same machine at the same time of day to check your readings and record them to bring to your follow-up visit.  Your physician recommends that you schedule a follow-up appointment in: about 1 month.

## 2013-10-19 DIAGNOSIS — R011 Cardiac murmur, unspecified: Secondary | ICD-10-CM | POA: Insufficient documentation
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
## 2013-10-23 ENCOUNTER — Institutional Professional Consult (permissible substitution): Payer: Self-pay | Admitting: Cardiology

## 2013-10-28 LAB — HM MAMMOGRAPHY

## 2013-11-01 DIAGNOSIS — Z1231 Encounter for screening mammogram for malignant neoplasm of breast: Secondary | ICD-10-CM | POA: Diagnosis not present

## 2013-11-06 ENCOUNTER — Ambulatory Visit (HOSPITAL_COMMUNITY): Payer: Medicare Other | Attending: Cardiology | Admitting: Cardiology

## 2013-11-06 DIAGNOSIS — R011 Cardiac murmur, unspecified: Secondary | ICD-10-CM | POA: Diagnosis not present

## 2013-11-06 NOTE — Progress Notes (Signed)
Echo performed. 

## 2013-11-18 ENCOUNTER — Telehealth: Payer: Self-pay | Admitting: *Deleted

## 2013-11-18 NOTE — Telephone Encounter (Signed)
Message copied by Katrine Coho on Mon Nov 18, 2013 11:53 AM ------      Message from: Larey Dresser      Created: Sun Nov 17, 2013 10:55 PM       Did not see it. There was only mild MR with normal EF.        ----- Message -----         From: Katrine Coho, RN         Sent: 11/15/2013  12:43 PM           To: Larey Dresser, MD            Did you see her echo done 11/06/13?        ------

## 2013-11-18 NOTE — Telephone Encounter (Signed)
Follow up  ° ° ° °Returning call back to nurse  °

## 2013-11-18 NOTE — Telephone Encounter (Signed)
Pt given echo results 

## 2013-11-18 NOTE — Telephone Encounter (Signed)
LMTCB

## 2013-11-26 ENCOUNTER — Encounter: Payer: Self-pay | Admitting: *Deleted

## 2013-11-26 ENCOUNTER — Encounter: Payer: Self-pay | Admitting: Cardiology

## 2013-11-26 ENCOUNTER — Ambulatory Visit (INDEPENDENT_AMBULATORY_CARE_PROVIDER_SITE_OTHER): Payer: Medicare Other | Admitting: Cardiology

## 2013-11-26 VITALS — BP 158/73 | HR 60 | Ht 63.0 in | Wt 112.0 lb

## 2013-11-26 DIAGNOSIS — I10 Essential (primary) hypertension: Secondary | ICD-10-CM

## 2013-11-26 DIAGNOSIS — R011 Cardiac murmur, unspecified: Secondary | ICD-10-CM | POA: Diagnosis not present

## 2013-11-26 DIAGNOSIS — I059 Rheumatic mitral valve disease, unspecified: Secondary | ICD-10-CM

## 2013-11-26 DIAGNOSIS — I340 Nonrheumatic mitral (valve) insufficiency: Secondary | ICD-10-CM | POA: Insufficient documentation

## 2013-11-26 MED ORDER — LISINOPRIL 20 MG PO TABS: 20.0000 mg | ORAL_TABLET | Freq: Every day | ORAL | Status: DC

## 2013-11-26 NOTE — Patient Instructions (Addendum)
Increase lisinopril to 20mg  daily.   Your physician has requested that you regularly monitor and record your blood pressure readings at home. Please use the same machine at the same time of day to check your readings. I will call you in 2 weeks to get the readings.  Your physician has requested that you have a renal artery duplex. During this test, an ultrasound is used to evaluate blood flow to the kidneys. Allow one hour for this exam. Do not eat after midnight the day before and avoid carbonated beverages. Take your medications as you usually do.  Your physician recommends that you return for lab work in: about 2 weeks-BMET-I have given you an order for this. Please fax the results to Dr McLean--[phone]  Your physician wants you to follow-up in: 1 year with Dr Aundra Dubin. (June 2016).You will receive a reminder letter in the mail two months in advance. If you don't receive a letter, please call our office to schedule the follow-up appointment.   Your physician has requested that you have an echocardiogram. Echocardiography is a painless test that uses sound waves to create images of your heart. It provides your doctor with information about the size and shape of your heart and how well your heart's chambers and valves are working. This procedure takes approximately one hour. There are no restrictions for this procedure. June 2016
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
## 2013-12-04 ENCOUNTER — Ambulatory Visit (HOSPITAL_COMMUNITY): Payer: Medicare Other | Attending: Cardiovascular Disease | Admitting: Cardiology

## 2013-12-04 DIAGNOSIS — I10 Essential (primary) hypertension: Secondary | ICD-10-CM | POA: Diagnosis not present

## 2013-12-04 DIAGNOSIS — E785 Hyperlipidemia, unspecified: Secondary | ICD-10-CM | POA: Insufficient documentation

## 2013-12-04 DIAGNOSIS — R011 Cardiac murmur, unspecified: Secondary | ICD-10-CM | POA: Insufficient documentation

## 2013-12-04 NOTE — Progress Notes (Signed)
Renal artery duplex performed  

## 2013-12-06 ENCOUNTER — Other Ambulatory Visit (INDEPENDENT_AMBULATORY_CARE_PROVIDER_SITE_OTHER): Payer: Medicare Other

## 2013-12-06 DIAGNOSIS — I10 Essential (primary) hypertension: Secondary | ICD-10-CM

## 2013-12-06 NOTE — Progress Notes (Signed)
Pt came in for lab  only 

## 2013-12-07 LAB — BMP8+EGFR
BUN / CREAT RATIO: 20 (ref 11–26)
BUN: 19 mg/dL (ref 8–27)
CO2: 25 mmol/L (ref 18–29)
Calcium: 9.7 mg/dL (ref 8.7–10.3)
Chloride: 100 mmol/L (ref 97–108)
Creatinine, Ser: 0.95 mg/dL (ref 0.57–1.00)
GFR calc non Af Amer: 63 mL/min/{1.73_m2} (ref >59)
GFR, EST AFRICAN AMERICAN: 73 mL/min/{1.73_m2} (ref >59)
Glucose: 96 mg/dL (ref 65–99)
POTASSIUM: 4.6 mmol/L (ref 3.5–5.2)
Sodium: 140 mmol/L (ref 134–144)

## 2013-12-10 ENCOUNTER — Telehealth: Payer: Self-pay | Admitting: *Deleted

## 2013-12-10 NOTE — Telephone Encounter (Signed)
11/27/13 128/72 134/77 11/28/13 139/77 167/80 11/29/13 120/77 148/94 11/30/13 150/76 12/01/13 127/76 149/93 12/02/13 111/70 101/67 12/03/13 124/72 126/67 12/04/13 118/65 143/75 12/05/13 122/85 142/78 12/06/13 138/74 147/69 12/07/13 132/81 142/82 12/08/13 140/88 145/61 12/09/13 132/70 126/92 12/10/13 154/71   Pt states she is taking lisinopril 20mg  daily, increased after 11/26/13 office visit with Dr Aundra Dubin. She is tolerating this OK but does seem to have more frequent hot flashes on higher dose of lisinopril.   I will forward to Dr Aundra Dubin for review.

## 2013-12-10 NOTE — Telephone Encounter (Signed)
No changes for now.

## 2013-12-11 ENCOUNTER — Telehealth: Payer: Self-pay | Admitting: *Deleted

## 2013-12-11 NOTE — Telephone Encounter (Signed)
Normal caliber aorta with normal renal arteries. ----- Message ----- From: Baxter Flattery Sent: 12/10/2013 12:18 PM To: Larey Dresser, MD  12/11/13 pt notified.

## 2013-12-11 NOTE — Telephone Encounter (Signed)
Pt notfied.

## 2014-01-14 ENCOUNTER — Encounter: Payer: Self-pay | Admitting: Gastroenterology

## 2014-01-23 ENCOUNTER — Encounter: Payer: Self-pay | Admitting: Family Medicine

## 2014-01-23 ENCOUNTER — Ambulatory Visit (INDEPENDENT_AMBULATORY_CARE_PROVIDER_SITE_OTHER): Payer: Medicare Other | Admitting: Family Medicine

## 2014-01-23 VITALS — BP 168/66 | HR 61 | Temp 98.0°F | Ht 63.0 in | Wt 109.6 lb

## 2014-01-23 DIAGNOSIS — R300 Dysuria: Secondary | ICD-10-CM

## 2014-01-23 DIAGNOSIS — N390 Urinary tract infection, site not specified: Secondary | ICD-10-CM

## 2014-01-23 DIAGNOSIS — R319 Hematuria, unspecified: Secondary | ICD-10-CM

## 2014-01-23 LAB — POCT UA - MICROSCOPIC ONLY
Bacteria, U Microscopic: NEGATIVE
Casts, Ur, LPF, POC: NEGATIVE
Crystals, Ur, HPF, POC: NEGATIVE
Mucus, UA: NEGATIVE
WBC, Ur, HPF, POC: NEGATIVE
Yeast, UA: NEGATIVE

## 2014-01-23 LAB — POCT URINALYSIS DIPSTICK
Bilirubin, UA: NEGATIVE
Glucose, UA: NEGATIVE
Ketones, UA: NEGATIVE
Leukocytes, UA: NEGATIVE
Nitrite, UA: NEGATIVE
Protein, UA: NEGATIVE
Spec Grav, UA: 1.015
Urobilinogen, UA: NEGATIVE
pH, UA: 5

## 2014-01-23 MED ORDER — CIPROFLOXACIN HCL 500 MG PO TABS: 500.0000 mg | ORAL_TABLET | Freq: Two times a day (BID) | ORAL | Status: DC

## 2014-01-23 NOTE — Progress Notes (Signed)
   Subjective:    Patient ID: Pam Franklin, female    DOB: Nov 10, 1950, 63 y.o.   MRN: [id number]  HPI This 63 y.o. female presents for evaluation of UTI sx's.   Review of Systems C/o dysuria  No chest pain, SOB, HA, dizziness, vision change, N/V, diarrhea, constipation, myalgias, arthralgias or rash.     Objective:   Physical Exam Vital signs noted  Well developed well nourished female.  HEENT - Head atraumatic Normocephalic Respiratory - Lungs CTA bilateral Cardiac - RRR S1 and S2 without murmur GI - Abdomen soft tender in  Bladder region.       Assessment & Plan:  Dysuria - Plan: POCT UA - Microscopic Only, POCT urinalysis dipstick, Urine culture, ciprofloxacin (CIPRO) 500 MG tablet  Urinary tract infection with hematuria, site unspecified - Plan: Urine culture, ciprofloxacin (CIPRO) 500 MG tablet  Pam Penner FNP

## 2014-01-27 LAB — URINE CULTURE

## 2014-03-18 ENCOUNTER — Other Ambulatory Visit: Payer: Self-pay | Admitting: Cardiology

## 2014-03-26 DIAGNOSIS — D485 Neoplasm of uncertain behavior of skin: Secondary | ICD-10-CM | POA: Diagnosis not present

## 2014-03-26 DIAGNOSIS — L821 Other seborrheic keratosis: Secondary | ICD-10-CM | POA: Diagnosis not present

## 2014-03-26 DIAGNOSIS — L98421 Non-pressure chronic ulcer of back limited to breakdown of skin: Secondary | ICD-10-CM | POA: Diagnosis not present

## 2014-03-26 DIAGNOSIS — L718 Other rosacea: Secondary | ICD-10-CM | POA: Diagnosis not present

## 2014-03-26 DIAGNOSIS — L570 Actinic keratosis: Secondary | ICD-10-CM | POA: Diagnosis not present

## 2014-03-26 DIAGNOSIS — L98429 Non-pressure chronic ulcer of back with unspecified severity: Secondary | ICD-10-CM | POA: Diagnosis not present

## 2014-04-18 DIAGNOSIS — N951 Menopausal and female climacteric states: Secondary | ICD-10-CM | POA: Diagnosis not present

## 2014-04-18 DIAGNOSIS — Z01419 Encounter for gynecological examination (general) (routine) without abnormal findings: Secondary | ICD-10-CM | POA: Diagnosis not present

## 2014-04-18 DIAGNOSIS — L900 Lichen sclerosus et atrophicus: Secondary | ICD-10-CM | POA: Diagnosis not present

## 2014-05-15 DIAGNOSIS — D485 Neoplasm of uncertain behavior of skin: Secondary | ICD-10-CM | POA: Diagnosis not present

## 2014-05-15 DIAGNOSIS — L98429 Non-pressure chronic ulcer of back with unspecified severity: Secondary | ICD-10-CM | POA: Diagnosis not present

## 2014-05-19 DIAGNOSIS — H40033 Anatomical narrow angle, bilateral: Secondary | ICD-10-CM | POA: Diagnosis not present

## 2014-05-19 DIAGNOSIS — H04123 Dry eye syndrome of bilateral lacrimal glands: Secondary | ICD-10-CM | POA: Diagnosis not present

## 2014-07-11 ENCOUNTER — Ambulatory Visit (INDEPENDENT_AMBULATORY_CARE_PROVIDER_SITE_OTHER): Payer: Medicare Other | Admitting: Family Medicine

## 2014-07-11 ENCOUNTER — Encounter: Payer: Self-pay | Admitting: Family Medicine

## 2014-07-11 ENCOUNTER — Telehealth: Payer: Self-pay | Admitting: Family Medicine

## 2014-07-11 VITALS — BP 185/76 | HR 66 | Temp 97.0°F | Ht 64.0 in | Wt 103.0 lb

## 2014-07-11 DIAGNOSIS — D692 Other nonthrombocytopenic purpura: Secondary | ICD-10-CM

## 2014-07-11 NOTE — Telephone Encounter (Signed)
appt scheduled for this afternoon. Patient aware.

## 2014-07-11 NOTE — Progress Notes (Signed)
   Subjective:    Patient ID: Pam Franklin, female    DOB: Feb 17, 1951, 64 y.o.   MRN: [id number]  HPI Patient is here for c/o right arm bruising.   Review of Systems    No chest pain, SOB, HA, dizziness, vision change, N/V, diarrhea, constipation, dysuria, urinary urgency or frequency, myalgias, arthralgias or rash.  Objective:   Physical Exam    Right arm with hematoma and bruising.  No signs of infection or cellulitis.    Assessment & Plan:  Senile purpura Reassurance, heating pad to right arm and hold ASA for a few days.  Lysbeth Penner FNP

## 2014-09-05 ENCOUNTER — Ambulatory Visit (INDEPENDENT_AMBULATORY_CARE_PROVIDER_SITE_OTHER): Payer: Medicare Other | Admitting: Nurse Practitioner

## 2014-09-05 ENCOUNTER — Encounter: Payer: Self-pay | Admitting: Nurse Practitioner

## 2014-09-05 VITALS — BP 165/77 | HR 62 | Temp 96.8°F | Ht 64.0 in | Wt 106.0 lb

## 2014-09-05 DIAGNOSIS — J0100 Acute maxillary sinusitis, unspecified: Secondary | ICD-10-CM | POA: Diagnosis not present

## 2014-09-05 MED ORDER — BENZONATATE 100 MG PO CAPS: 100.0000 mg | ORAL_CAPSULE | Freq: Three times a day (TID) | ORAL | Status: DC | PRN

## 2014-09-05 MED ORDER — AZITHROMYCIN 250 MG PO TABS: ORAL_TABLET | ORAL | Status: DC

## 2014-09-05 NOTE — Patient Instructions (Signed)

## 2014-09-05 NOTE — Progress Notes (Signed)
  Subjective:     Pam Franklin is a 64 y.o. female here for evaluation of a cough. Onset of symptoms was 1 day ago. Symptoms have been unchanged since that time. The cough is nonproductive and wet and is aggravated by nothing. Associated symptoms include: chills, postnasal drip and sinus pressure. . Patient does not have a history of asthma. Patient does have a history of environmental allergens. Patient has not traveled recently. Patient does not have a history of smoking. Patient has had a previous chest x-ray. Patient has had a PPD done.  Review of Systems Constitutional: positive for chills, fatigue and fevers Eyes: positive for dry and itchy. No discharge, pain or vision changes.  Ears, nose, mouth, throat, and face: positive for hoarseness and nasal congestion Respiratory: negative for asthma, pleurisy/chest pain and wheezing Cardiovascular: negative    Objective:     BP 165/77 mmHg  Pulse 62  Temp(Src) 96.8 F (36 C) (Oral)  Ht 5\' 4"  (1.626 m)  Wt 106 lb (48.081 kg)  BMI 18.19 kg/m2 General appearance: alert, cooperative, appears older than stated age and no distress Eyes: negative findings: lids and lashes normal, conjunctivae and sclerae normal and corneas clear Ears: normal TM's and external ear canals both ears Nose: Nares normal. Septum midline. Mucosa normal. No drainage or sinus tenderness., no discharge, scant discharge, sinus tenderness bilateral, no polyps Throat: lips, mucosa, and tongue normal; teeth and gums normal Neck: no adenopathy, supple, symmetrical, trachea midline and tenderness to bilateral tonsillar lymph node, non-palpable Lungs: clear to auscultation bilaterally Heart: regular rate and rhythm, S1, S2 normal, no murmur, click, rub or gallop    Assessment:     Maxillary sinusitis   Plan:      1. Take meds as prescribed 2. Use a cool mist humidifier especially during the winter months and when heat has been humid. 3. Use saline nose sprays  frequently 4. Saline irrigations of the nose can be very helpful if done frequently.  * 4X daily for 1 week*  * Use of a nettie pot can be helpful with this. Follow directions with this* 5. Drink plenty of fluids 6. Keep thermostat turn down low 7.For any cough or congestion  Use plain Mucinex- regular strength or max strength is fine   * Children- consult with Pharmacist for dosing 8. For fever or aces or pains- take tylenol or ibuprofen appropriate for age and weight.  * for fevers greater than 101 orally you may alternate ibuprofen and tylenol every  3 hours.   Meds ordered this encounter  Medications  . azithromycin (ZITHROMAX Z-PAK) 250 MG tablet    Sig: As directed    Dispense:  1 each    Refill:  0    Order Specific Question:  Supervising Provider    Answer:  Chipper Herb [[id number]]  . benzonatate (TESSALON PERLES) 100 MG capsule    Sig: Take 1 capsule (100 mg total) by mouth 3 (three) times daily as needed for cough.    Dispense:  20 capsule    Refill:  0    Order Specific Question:  Supervising Provider    Answer:  Chipper Herb [place], FNP

## 2014-10-28 DIAGNOSIS — L820 Inflamed seborrheic keratosis: Secondary | ICD-10-CM | POA: Diagnosis not present

## 2014-10-28 DIAGNOSIS — L814 Other melanin hyperpigmentation: Secondary | ICD-10-CM | POA: Diagnosis not present

## 2014-11-03 DIAGNOSIS — Z1231 Encounter for screening mammogram for malignant neoplasm of breast: Secondary | ICD-10-CM | POA: Diagnosis not present

## 2014-11-07 ENCOUNTER — Encounter: Payer: Self-pay | Admitting: Physician Assistant

## 2014-11-07 ENCOUNTER — Ambulatory Visit (INDEPENDENT_AMBULATORY_CARE_PROVIDER_SITE_OTHER): Payer: Medicare Other | Admitting: Physician Assistant

## 2014-11-07 ENCOUNTER — Encounter (INDEPENDENT_AMBULATORY_CARE_PROVIDER_SITE_OTHER): Payer: Self-pay

## 2014-11-07 VITALS — BP 162/74 | HR 80 | Temp 98.6°F | Ht 64.0 in | Wt 103.0 lb

## 2014-11-07 DIAGNOSIS — Z87442 Personal history of urinary calculi: Secondary | ICD-10-CM

## 2014-11-07 DIAGNOSIS — N3090 Cystitis, unspecified without hematuria: Secondary | ICD-10-CM

## 2014-11-07 DIAGNOSIS — R109 Unspecified abdominal pain: Secondary | ICD-10-CM

## 2014-11-07 DIAGNOSIS — R509 Fever, unspecified: Secondary | ICD-10-CM

## 2014-11-07 LAB — POCT URINALYSIS DIPSTICK
Bilirubin, UA: NEGATIVE
Glucose, UA: NEGATIVE
KETONES UA: NEGATIVE
LEUKOCYTES UA: NEGATIVE
Nitrite, UA: NEGATIVE
PH UA: 7.5
Protein, UA: NEGATIVE
SPEC GRAV UA: 1.01
UROBILINOGEN UA: NEGATIVE

## 2014-11-07 LAB — POCT UA - MICROSCOPIC ONLY
BACTERIA, U MICROSCOPIC: NEGATIVE
CASTS, UR, LPF, POC: NEGATIVE
Crystals, Ur, HPF, POC: NEGATIVE
Mucus, UA: NEGATIVE
RBC, urine, microscopic: NEGATIVE
Yeast, UA: NEGATIVE

## 2014-11-07 LAB — POCT INFLUENZA A/B
INFLUENZA B, POC: NEGATIVE
Influenza A, POC: NEGATIVE

## 2014-11-07 MED ORDER — CIPROFLOXACIN HCL 500 MG PO TABS: 500.0000 mg | ORAL_TABLET | Freq: Two times a day (BID) | ORAL | Status: DC

## 2014-11-07 NOTE — Progress Notes (Signed)
   Subjective:    Patient ID: Pam Franklin, female    DOB: 10-05-1950, 64 y.o.   MRN: [id number]  HPI 64 y/o female presents with c/o chills, neck pain, low back pain x 1 day. She had nausea last night. Took 400 mg ibuprofen for fever ( 101F) H/o nephrolithiasis - had a scan a few years ago, was told she had several stones but has only passed 3 of them.   No associated sick contacts. Tick bite 1 week ago.     Review of Systems  Constitutional: Positive for fever, chills, diaphoresis and fatigue.  HENT: Negative.   Gastrointestinal: Positive for nausea and abdominal pain (suprapubic). Negative for diarrhea and constipation.  Endocrine: Positive for polyuria.  Genitourinary: Positive for frequency and flank pain.  Musculoskeletal: Positive for back pain (lumbar bilateral ).       Objective:   Physical Exam  Constitutional: She is oriented to person, place, and time. She appears well-developed and well-nourished. No distress.  HENT:  Head: Normocephalic.  Pulmonary/Chest: Effort normal.  Abdominal: She exhibits no mass. There is tenderness (ttp LLQ and suprapubic). There is no rebound and no guarding.  CVA ttp on left side    Musculoskeletal: She exhibits no edema.  Neurological: She is alert and oriented to person, place, and time.  Skin: She is not diaphoretic.  Psychiatric: She has a normal mood and affect. Her behavior is normal. Judgment and thought content normal.  Nursing note and vitals reviewed.         Assessment & Plan:  1. Flank pain  - POCT urinalysis dipstick - POCT UA - Microscopic Only - Urine culture  2. Fever and chills - Tylenol or motrin for fever - POCT Influenza A/B - Urine culture  3. Cystitis  - ciprofloxacin (CIPRO) 500 MG tablet; Take 1 tablet (500 mg total) by mouth 2 (two) times daily.  Dispense: 14 tablet; Refill: 0 - Urine culture  4. History of nephrolithiasis - Urine strainer given due to high likelihood of a stone.  - non  caffeine fluids   Continue all meds Labs pending  RTO 1 week for reassessment   Taeshawn Helfman A. Benjamin Stain PA-C

## 2014-11-08 LAB — URINE CULTURE

## 2014-11-10 ENCOUNTER — Other Ambulatory Visit (HOSPITAL_COMMUNITY): Payer: Medicare Other

## 2014-11-10 ENCOUNTER — Ambulatory Visit (HOSPITAL_COMMUNITY): Payer: Medicare Other | Attending: Cardiology

## 2014-11-10 ENCOUNTER — Other Ambulatory Visit: Payer: Self-pay

## 2014-11-10 DIAGNOSIS — I10 Essential (primary) hypertension: Secondary | ICD-10-CM

## 2014-11-10 DIAGNOSIS — R010 Benign and innocent cardiac murmurs: Secondary | ICD-10-CM

## 2014-11-10 DIAGNOSIS — R921 Mammographic calcification found on diagnostic imaging of breast: Secondary | ICD-10-CM | POA: Diagnosis not present

## 2014-11-10 DIAGNOSIS — R928 Other abnormal and inconclusive findings on diagnostic imaging of breast: Secondary | ICD-10-CM | POA: Diagnosis not present

## 2014-11-10 DIAGNOSIS — R011 Cardiac murmur, unspecified: Secondary | ICD-10-CM | POA: Insufficient documentation

## 2014-11-13 ENCOUNTER — Ambulatory Visit: Payer: Medicare Other | Admitting: Physician Assistant

## 2014-11-18 ENCOUNTER — Ambulatory Visit: Payer: Medicare Other | Admitting: Physician Assistant

## 2014-11-19 ENCOUNTER — Encounter: Payer: Self-pay | Admitting: *Deleted
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
## 2014-11-20 ENCOUNTER — Ambulatory Visit (INDEPENDENT_AMBULATORY_CARE_PROVIDER_SITE_OTHER): Payer: Medicare Other | Admitting: Physician Assistant

## 2014-11-20 ENCOUNTER — Encounter: Payer: Self-pay | Admitting: Physician Assistant

## 2014-11-20 VITALS — BP 120/50 | HR 64 | Ht 64.0 in | Wt 105.0 lb

## 2014-11-20 DIAGNOSIS — I10 Essential (primary) hypertension: Secondary | ICD-10-CM | POA: Diagnosis not present

## 2014-11-20 DIAGNOSIS — I340 Nonrheumatic mitral (valve) insufficiency: Secondary | ICD-10-CM

## 2014-11-20 LAB — BASIC METABOLIC PANEL
BUN: 24 mg/dL — AB (ref 6–23)
CO2: 33 mEq/L — ABNORMAL HIGH (ref 19–32)
Calcium: 9.4 mg/dL (ref 8.4–10.5)
Chloride: 104 mEq/L (ref 96–112)
Creatinine, Ser: 0.96 mg/dL (ref 0.40–1.20)
GFR: 61.74 mL/min (ref >60.00)
GLUCOSE: 118 mg/dL — AB (ref 70–99)
POTASSIUM: 4.5 meq/L (ref 3.5–5.1)
Sodium: 141 mEq/L (ref 135–145)

## 2014-11-20 NOTE — Patient Instructions (Addendum)
Medication Instructions:  Your physician recommends that you continue on your current medications as directed. Please refer to the Current Medication list given to you today.  Labwork:  BMET   Testing/Procedures:Your physician has requested that you have an echocardiogram. IN 2 YEARS  Echocardiography is a painless test that uses sound waves to create images of your heart. It provides your doctor with information about the size and shape of your heart and how well your heart's chambers and valves are working. This procedure takes approximately one hour. There are no restrictions for this procedure.    Follow-Up:   WITH DR [facility] IN 2 YEARS    Any Other Special Instructions Will Be Listed Below (If Applicable).

## 2014-11-21 ENCOUNTER — Telehealth: Payer: Self-pay | Admitting: *Deleted

## 2014-11-21 NOTE — Telephone Encounter (Signed)
Pt notified of lab results with verbal understanding by phone. 

## 2015-01-09 DIAGNOSIS — L820 Inflamed seborrheic keratosis: Secondary | ICD-10-CM | POA: Diagnosis not present

## 2015-01-09 DIAGNOSIS — L718 Other rosacea: Secondary | ICD-10-CM | POA: Diagnosis not present

## 2015-02-06 DIAGNOSIS — Z23 Encounter for immunization: Secondary | ICD-10-CM | POA: Diagnosis not present

## 2015-02-17 ENCOUNTER — Telehealth: Payer: Self-pay | Admitting: Physician Assistant

## 2015-02-26 ENCOUNTER — Ambulatory Visit: Payer: Medicare Other | Admitting: Family Medicine

## 2015-03-04 ENCOUNTER — Other Ambulatory Visit: Payer: Self-pay | Admitting: Cardiology

## 2015-05-13 DIAGNOSIS — R921 Mammographic calcification found on diagnostic imaging of breast: Secondary | ICD-10-CM | POA: Diagnosis not present

## 2015-05-28 DIAGNOSIS — H04123 Dry eye syndrome of bilateral lacrimal glands: Secondary | ICD-10-CM | POA: Diagnosis not present

## 2015-05-28 DIAGNOSIS — H40033 Anatomical narrow angle, bilateral: Secondary | ICD-10-CM | POA: Diagnosis not present

## 2015-06-03 DIAGNOSIS — H04123 Dry eye syndrome of bilateral lacrimal glands: Secondary | ICD-10-CM | POA: Diagnosis not present

## 2015-06-25 ENCOUNTER — Telehealth: Payer: Self-pay | Admitting: Physician Assistant

## 2015-06-26 NOTE — Telephone Encounter (Signed)
Scheduled

## 2015-07-02 DIAGNOSIS — H04123 Dry eye syndrome of bilateral lacrimal glands: Secondary | ICD-10-CM | POA: Diagnosis not present

## 2015-07-04 DIAGNOSIS — G44219 Episodic tension-type headache, not intractable: Secondary | ICD-10-CM | POA: Diagnosis not present

## 2015-07-10 ENCOUNTER — Ambulatory Visit (INDEPENDENT_AMBULATORY_CARE_PROVIDER_SITE_OTHER): Payer: Medicare Other | Admitting: Family Medicine

## 2015-07-10 ENCOUNTER — Encounter: Payer: Self-pay | Admitting: Family Medicine

## 2015-07-10 ENCOUNTER — Ambulatory Visit (INDEPENDENT_AMBULATORY_CARE_PROVIDER_SITE_OTHER): Payer: Medicare Other

## 2015-07-10 VITALS — BP 162/85 | HR 74 | Temp 97.0°F | Ht 64.0 in | Wt 94.2 lb

## 2015-07-10 DIAGNOSIS — E2839 Other primary ovarian failure: Secondary | ICD-10-CM

## 2015-07-10 DIAGNOSIS — Z780 Asymptomatic menopausal state: Secondary | ICD-10-CM

## 2015-07-10 DIAGNOSIS — I10 Essential (primary) hypertension: Secondary | ICD-10-CM

## 2015-07-10 DIAGNOSIS — Z0000 Encounter for general adult medical examination without abnormal findings: Secondary | ICD-10-CM | POA: Diagnosis not present

## 2015-07-10 DIAGNOSIS — R7303 Prediabetes: Secondary | ICD-10-CM

## 2015-07-10 DIAGNOSIS — R7301 Impaired fasting glucose: Secondary | ICD-10-CM

## 2015-07-10 NOTE — Patient Instructions (Signed)
Great top see you!  Lets see you back in 4 weeks to discuss hypertension and review your labs  Come in for labs at least 1 week before your appointment
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
## 2015-07-13 ENCOUNTER — Ambulatory Visit: Payer: Medicare Other | Admitting: Pharmacist

## 2015-07-23 ENCOUNTER — Ambulatory Visit (INDEPENDENT_AMBULATORY_CARE_PROVIDER_SITE_OTHER): Payer: Medicare Other | Admitting: Pharmacist

## 2015-07-23 ENCOUNTER — Encounter: Payer: Self-pay | Admitting: Pharmacist

## 2015-07-23 VITALS — Ht 63.0 in | Wt 95.5 lb

## 2015-07-23 DIAGNOSIS — M8580 Other specified disorders of bone density and structure, unspecified site: Secondary | ICD-10-CM

## 2015-07-23 NOTE — Patient Instructions (Signed)
Exercise for Strong Bones  Exercise is important to build and maintain strong bones / bone density.  There are 2 types of exercises that are important to building and maintaining strong bones:  Weight- bearing and muscle-stregthening.  Weight-bearing Exercises  These exercises include activities that make you move against gravity while staying upright. Weight-bearing exercises can be high-impact or low-impact.  High-impact weight-bearing exercises help build bones and keep them strong. If you have broken a bone due to osteoporosis or are at risk of breaking a bone, you may need to avoid high-impact exercises. If you're not sure, you should check with your healthcare provider.  Examples of high-impact weight-bearing exercises are: Dancing  Doing high-impact aerobics  Hiking  Jogging/running  Jumping Rope  Stair climbing  Tennis  Low-impact weight-bearing exercises can also help keep bones strong and are a safe alternative if you cannot do high-impact exercises.   Examples of low-impact weight-bearing exercises are: Using elliptical training machines  Doing low-impact aerobics  Using stair-step machines  Fast walking on a treadmill or outside   Muscle-Strengthening Exercises These exercises include activities where you move your body, a weight or some other resistance against gravity. They are also known as resistance exercises and include: Lifting weights  Using elastic exercise bands  Using weight machines  Lifting your own body weight  Functional movements, such as standing and rising up on your toes  Yoga and Pilates can also improve strength, balance and flexibility. However, certain positions may not be safe for people with osteoporosis or those at increased risk of broken bones. For example, exercises that have you bend forward may increase the chance of breaking a bone in the spine.   Non-Impact Exercises There are other types of exercises that can help  prevent falls.  Non-impact exercises can help you to improve balance, posture and how well you move in everyday activities. Some of these exercises include: Balance exercises that strengthen your legs and test your balance, such as Tai Chi, can decrease your risk of falls.  Posture exercises that improve your posture and reduce rounded or "sloping" shoulders can help you decrease the chance of breaking a bone, especially in the spine.  Functional exercises that improve how well you move can help you with everyday activities and decrease your chance of falling and breaking a bone. For example, if you have trouble getting up from a chair or climbing stairs, you should do these activities as exercises.   **A physical therapist can teach you balance, posture and functional exercises. He/she can also help you learn which exercises are safe and appropriate for you.  [facility] has a physical therapy office in [place] in front of our office and referrals can be made for assessments and treatment as needed and strength and balance training.  If you would like to have an assessment with Chad and our physical therapy team please let a nurse or provider know.    Fall Prevention in the Home  Falls can cause injuries and can affect people from all age groups. There are many simple things that you can do to make your home safe and to help prevent falls. WHAT CAN I DO ON THE OUTSIDE OF MY HOME?  Regularly repair the edges of walkways and driveways and fix any cracks.  Remove high doorway thresholds.  Trim any shrubbery on the main path into your home.  Use bright outdoor lighting.  Clear walkways of debris and clutter, including tools and rocks.  Regularly check   that handrails are securely fastened and in good repair. Both sides of any steps should have handrails.  Install guardrails along the edges of any raised decks or porches.  Have leaves, snow, and ice cleared regularly.  Use sand or salt on  walkways during winter months.  In the garage, clean up any spills right away, including grease or oil spills. WHAT CAN I DO IN THE BATHROOM?  Use night lights.  Install grab bars by the toilet and in the tub and shower. Do not use towel bars as grab bars.  Use non-skid mats or decals on the floor of the tub or shower.  If you need to sit down while you are in the shower, use a plastic, non-slip stool..  Keep the floor dry. Immediately clean up any water that spills on the floor.  Remove soap buildup in the tub or shower on a regular basis.  Attach bath mats securely with double-sided non-slip rug tape.  Remove throw rugs and other tripping hazards from the floor. WHAT CAN I DO IN THE BEDROOM?  Use night lights.  Make sure that a bedside light is easy to reach.  Do not use oversized bedding that drapes onto the floor.  Have a firm chair that has side arms to use for getting dressed.  Remove throw rugs and other tripping hazards from the floor. WHAT CAN I DO IN THE KITCHEN?   Clean up any spills right away.  Avoid walking on wet floors.  Place frequently used items in easy-to-reach places.  If you need to reach for something above you, use a sturdy step stool that has a grab bar.  Keep electrical cables out of the way.  Do not use floor polish or wax that makes floors slippery. If you have to use wax, make sure that it is non-skid floor wax.  Remove throw rugs and other tripping hazards from the floor. WHAT CAN I DO IN THE STAIRWAYS?  Do not leave any items on the stairs.  Make sure that there are handrails on both sides of the stairs. Fix handrails that are broken or loose. Make sure that handrails are as long as the stairways.  Check any carpeting to make sure that it is firmly attached to the stairs. Fix any carpet that is loose or worn.  Avoid having throw rugs at the top or bottom of stairways, or secure the rugs with carpet tape to prevent them from  moving.  Make sure that you have a light switch at the top of the stairs and the bottom of the stairs. If you do not have them, have them installed. WHAT ARE SOME OTHER FALL PREVENTION TIPS?  Wear closed-toe shoes that fit well and support your feet. Wear shoes that have rubber soles or low heels.  When you use a stepladder, make sure that it is completely opened and that the sides are firmly locked. Have someone hold the ladder while you are using it. Do not climb a closed stepladder.  Add color or contrast paint or tape to grab bars and handrails in your home. Place contrasting color strips on the first and last steps.  Use mobility aids as needed, such as canes, walkers, scooters, and crutches.  Turn on lights if it is dark. Replace any light bulbs that burn out.  Set up furniture so that there are clear paths. Keep the furniture in the same spot.  Fix any uneven floor surfaces.  Choose a carpet design that does not   hide the edge of steps of a stairway.  Be aware of any and all pets.  Review your medicines with your healthcare provider. Some medicines can cause dizziness or changes in blood pressure, which increase your risk of falling. Talk with your health care provider about other ways that you can decrease your risk of falls. This may include working with a physical therapist or trainer to improve your strength, balance, and endurance.   This information is not intended to replace advice given to you by your health care provider. Make sure you discuss any questions you have with your health care provider.   Document Released: 05/06/2002 Document Revised: 09/30/2014 Document Reviewed: 06/20/2014 Elsevier Interactive Patient Education 2016 [employer]  
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
## 2015-07-30 DIAGNOSIS — Z79890 Hormone replacement therapy (postmenopausal): Secondary | ICD-10-CM | POA: Diagnosis not present

## 2015-07-30 DIAGNOSIS — Z780 Asymptomatic menopausal state: Secondary | ICD-10-CM | POA: Diagnosis not present

## 2015-07-31 ENCOUNTER — Other Ambulatory Visit (INDEPENDENT_AMBULATORY_CARE_PROVIDER_SITE_OTHER): Payer: Medicare Other

## 2015-07-31 ENCOUNTER — Encounter (INDEPENDENT_AMBULATORY_CARE_PROVIDER_SITE_OTHER): Payer: Self-pay

## 2015-07-31 DIAGNOSIS — M8580 Other specified disorders of bone density and structure, unspecified site: Secondary | ICD-10-CM

## 2015-07-31 DIAGNOSIS — Z0000 Encounter for general adult medical examination without abnormal findings: Secondary | ICD-10-CM

## 2015-07-31 DIAGNOSIS — R7301 Impaired fasting glucose: Secondary | ICD-10-CM

## 2015-07-31 DIAGNOSIS — I10 Essential (primary) hypertension: Secondary | ICD-10-CM

## 2015-07-31 DIAGNOSIS — M8588 Other specified disorders of bone density and structure, other site: Secondary | ICD-10-CM | POA: Diagnosis not present

## 2015-07-31 LAB — POCT GLYCOSYLATED HEMOGLOBIN (HGB A1C): Hemoglobin A1C: 5.7

## 2015-08-01 LAB — CMP14+EGFR
ALT: 10 IU/L (ref 0–32)
AST: 11 IU/L (ref 0–40)
Albumin/Globulin Ratio: 1.9 (ref 1.1–2.5)
Albumin: 4.3 g/dL (ref 3.6–4.8)
Alkaline Phosphatase: 69 IU/L (ref 39–117)
BUN/Creatinine Ratio: 26 (ref 11–26)
BUN: 24 mg/dL (ref 8–27)
Bilirubin Total: 0.5 mg/dL (ref 0.0–1.2)
CO2: 25 mmol/L (ref 18–29)
Calcium: 9.8 mg/dL (ref 8.7–10.3)
Chloride: 103 mmol/L (ref 96–106)
Creatinine, Ser: 0.94 mg/dL (ref 0.57–1.00)
GFR calc Af Amer: 73 mL/min/{1.73_m2} (ref >59)
GFR calc non Af Amer: 63 mL/min/{1.73_m2} (ref >59)
Globulin, Total: 2.3 g/dL (ref 1.5–4.5)
Glucose: 107 mg/dL — ABNORMAL HIGH (ref 65–99)
Potassium: 4.9 mmol/L (ref 3.5–5.2)
Sodium: 146 mmol/L — ABNORMAL HIGH (ref 134–144)
Total Protein: 6.6 g/dL (ref 6.0–8.5)

## 2015-08-01 LAB — CBC
HEMOGLOBIN: 12.5 g/dL (ref 11.1–15.9)
Hematocrit: 38 % (ref 34.0–46.6)
MCH: 31.3 pg (ref 26.6–33.0)
MCHC: 32.9 g/dL (ref 31.5–35.7)
MCV: 95 fL (ref 79–97)
PLATELETS: 267 10*3/uL (ref 150–379)
RBC: 4 x10E6/uL (ref 3.77–5.28)
RDW: 14 % (ref 12.3–15.4)
WBC: 7.2 10*3/uL (ref 3.4–10.8)

## 2015-08-01 LAB — LIPID PANEL
Chol/HDL Ratio: 2.4 ratio units (ref 0.0–4.4)
Cholesterol, Total: 187 mg/dL (ref 100–199)
HDL: 78 mg/dL (ref >39)
LDL CALC: 96 mg/dL (ref 0–99)
Triglycerides: 67 mg/dL (ref 0–149)
VLDL CHOLESTEROL CAL: 13 mg/dL (ref 5–40)

## 2015-08-01 LAB — VITAMIN D 25 HYDROXY (VIT D DEFICIENCY, FRACTURES): VIT D 25 HYDROXY: 40 ng/mL (ref 30.0–100.0)

## 2015-08-01 LAB — HEPATITIS C ANTIBODY: Hep C Virus Ab: <0.1 s/co ratio (ref 0.0–0.9)

## 2015-08-05 ENCOUNTER — Telehealth: Payer: Self-pay | Admitting: Family Medicine

## 2015-08-05 NOTE — Telephone Encounter (Signed)
Reviewed results with patient. 

## 2015-08-07 ENCOUNTER — Ambulatory Visit: Payer: Medicare Other | Admitting: Family Medicine

## 2015-08-12 ENCOUNTER — Encounter: Payer: Self-pay | Admitting: Family Medicine

## 2015-08-12 ENCOUNTER — Ambulatory Visit (INDEPENDENT_AMBULATORY_CARE_PROVIDER_SITE_OTHER): Payer: Medicare Other | Admitting: Family Medicine

## 2015-08-12 VITALS — BP 187/67 | HR 65 | Temp 97.0°F | Ht 63.0 in | Wt 97.0 lb

## 2015-08-12 DIAGNOSIS — I10 Essential (primary) hypertension: Secondary | ICD-10-CM

## 2015-08-12 MED ORDER — HYDROCHLOROTHIAZIDE 25 MG PO TABS: 25.0000 mg | ORAL_TABLET | Freq: Every day | ORAL | Status: DC

## 2015-08-12 NOTE — Progress Notes (Signed)
BP 187/67 mmHg  Pulse 65  Temp(Src) 97 F (36.1 C) (Oral)  Ht 5' 3"  (1.6 m)  Wt 97 lb (43.999 kg)  BMI 17.19 kg/m2   Subjective:    Patient ID: Pam Franklin, female    DOB: 13-Nov-1950, 65 y.o.   MRN: [id number]  HPI: Pam Franklin is a 65 y.o. female presenting on 08/12/2015 for Hypertension   HPI Hypertension Patient is coming in today for hypertension because her blood pressure is been elevated consistently at home. She this morning got 195/101 and then went to [facility] and it was 195/90. She has been consistently getting numbers in the 180s to 190s over the 60s to 100. She denies any chest pain or blurred vision or focal numbness or weakness. She does admit to having a dull achy headache that is new or for her. She denies any blurred vision. Today in the office she was 187/67. She is currently on lisinopril and she took about 4:30 and her blood pressure checks were at 8 and 9 AM this morning.  Relevant past medical, surgical, family and social history reviewed and updated as indicated. Interim medical history since our last visit reviewed. Allergies and medications reviewed and updated.  Review of Systems  Constitutional: Negative for fever and chills.  HENT: Negative for congestion, ear discharge and ear pain.   Eyes: Negative for redness and visual disturbance.  Respiratory: Negative for chest tightness and shortness of breath.   Cardiovascular: Negative for chest pain, palpitations and leg swelling.  Genitourinary: Negative for dysuria and difficulty urinating.  Musculoskeletal: Negative for back pain and gait problem.  Skin: Negative for rash.  Neurological: Positive for headaches. Negative for dizziness, facial asymmetry, speech difficulty, weakness and light-headedness.  Psychiatric/Behavioral: Negative for behavioral problems and agitation.  All other systems reviewed and are negative.   Per HPI unless specifically indicated above     Medication List       This list  is accurate as of: 08/12/15 10:19 AM.  Always use your most recent med list.               aspirin 81 MG tablet  Take 81 mg by mouth daily.     azelaic acid 20 % cream  Commonly known as:  AZELEX  Apply topically 2 (two) times daily. After skin is thoroughly washed and patted dry, gently but thoroughly massage a thin film of azelaic acid cream into the affected area twice daily, in the morning and evening.     Cinnamon 500 MG capsule  Take 500 mg by mouth daily.     cycloSPORINE 0.05 % ophthalmic emulsion  Commonly known as:  RESTASIS  1 drop 2 (two) times daily.     estradiol 0.0375 MG/24HR  Commonly known as:  VIVELLE-DOT  Place 1 patch onto the skin 2 (two) times a week.     hydrochlorothiazide 25 MG tablet  Commonly known as:  HYDRODIURIL  Take 1 tablet (25 mg total) by mouth daily.     lisinopril 20 MG tablet  Commonly known as:  PRINIVIL,ZESTRIL  TAKE ONE TABLET BY MOUTH ONE TIME DAILY     ranitidine 75 MG tablet  Commonly known as:  ZANTAC  Take 75 mg by mouth daily.           Objective:    BP 187/67 mmHg  Pulse 65  Temp(Src) 97 F (36.1 C) (Oral)  Ht 5' 3"  (1.6 m)  Wt 97 lb (43.999 kg)  BMI  17.19 kg/m2  Wt Readings from Last 3 Encounters:  08/12/15 97 lb (43.999 kg)  07/23/15 95 lb 8 oz (43.319 kg)  07/10/15 94 lb 3.2 oz (42.729 kg)    Physical Exam  Constitutional: She is oriented to person, place, and time. She appears well-developed and well-nourished. No distress.  Eyes: Conjunctivae and EOM are normal. Pupils are equal, round, and reactive to light.  Neck: Neck supple. No thyromegaly present.  Cardiovascular: Normal rate, regular rhythm, normal heart sounds and intact distal pulses.   No murmur heard. Pulmonary/Chest: Effort normal and breath sounds normal. No respiratory distress. She has no wheezes.  Musculoskeletal: Normal range of motion. She exhibits no edema or tenderness.  Lymphadenopathy:    She has no cervical adenopathy.    Neurological: She is alert and oriented to person, place, and time. Coordination normal.  Skin: Skin is warm and dry. No rash noted. She is not diaphoretic.  Psychiatric: She has a normal mood and affect. Her behavior is normal.  Nursing note and vitals reviewed.   Results for orders placed or performed in visit on 07/31/15  CMP14+EGFR  Result Value Ref Range   Glucose 107 (H) 65 - 99 mg/dL   BUN 24 8 - 27 mg/dL   Creatinine, Ser 0.94 0.57 - 1.00 mg/dL   GFR calc non Af Amer 63 >59 mL/min/1.73   GFR calc Af Amer 73 >59 mL/min/1.73   BUN/Creatinine Ratio 26 11 - 26   Sodium 146 (H) 134 - 144 mmol/L   Potassium 4.9 3.5 - 5.2 mmol/L   Chloride 103 96 - 106 mmol/L   CO2 25 18 - 29 mmol/L   Calcium 9.8 8.7 - 10.3 mg/dL   Total Protein 6.6 6.0 - 8.5 g/dL   Albumin 4.3 3.6 - 4.8 g/dL   Globulin, Total 2.3 1.5 - 4.5 g/dL   Albumin/Globulin Ratio 1.9 1.1 - 2.5   Bilirubin Total 0.5 0.0 - 1.2 mg/dL   Alkaline Phosphatase 69 39 - 117 IU/L   AST 11 0 - 40 IU/L   ALT 10 0 - 32 IU/L  CBC  Result Value Ref Range   WBC 7.2 3.4 - 10.8 x10E3/uL   RBC 4.00 3.77 - 5.28 x10E6/uL   Hemoglobin 12.5 11.1 - 15.9 g/dL   Hematocrit 38.0 34.0 - 46.6 %   MCV 95 79 - 97 fL   MCH 31.3 26.6 - 33.0 pg   MCHC 32.9 31.5 - 35.7 g/dL   RDW 14.0 12.3 - 15.4 %   Platelets 267 150 - 379 x10E3/uL  Lipid Panel  Result Value Ref Range   Cholesterol, Total 187 100 - 199 mg/dL   Triglycerides 67 0 - 149 mg/dL   HDL 78 >39 mg/dL   VLDL Cholesterol Cal 13 5 - 40 mg/dL   LDL Calculated 96 0 - 99 mg/dL   Chol/HDL Ratio 2.4 0.0 - 4.4 ratio units  Hepatitis C Antibody  Result Value Ref Range   Hep C Virus Ab <0.1 0.0 - 0.9 s/co ratio  VITAMIN D 25 Hydroxy (Vit-D Deficiency, Fractures)  Result Value Ref Range   Vit D, 25-Hydroxy 40.0 30.0 - 100.0 ng/mL  POCT glycosylated hemoglobin (Hb A1C)  Result Value Ref Range   Hemoglobin A1C 5.7       Assessment & Plan:   Problem List Items Addressed This Visit       Cardiovascular and Mediastinum   Essential hypertension, benign - Primary   Relevant Medications   hydrochlorothiazide (HYDRODIURIL) 25  MG tablet      Follow up plan: Return in about 1 week (around 08/19/2015), or if symptoms worsen or fail to improve, for Already has an appointment with Dr. Wendi Snipes.  Counseling provided for all of the vaccine components No orders of the defined types were placed in this encounter.    Caryl Pina, MD [facility] 08/12/2015, 10:19 AM

## 2015-08-20 ENCOUNTER — Encounter: Payer: Self-pay | Admitting: Family Medicine

## 2015-08-20 ENCOUNTER — Ambulatory Visit (INDEPENDENT_AMBULATORY_CARE_PROVIDER_SITE_OTHER): Payer: Medicare Other | Admitting: Family Medicine

## 2015-08-20 VITALS — BP 134/66 | HR 70 | Temp 96.9°F | Ht 63.0 in | Wt 95.2 lb

## 2015-08-20 DIAGNOSIS — R252 Cramp and spasm: Secondary | ICD-10-CM

## 2015-08-20 DIAGNOSIS — R7303 Prediabetes: Secondary | ICD-10-CM | POA: Diagnosis not present

## 2015-08-20 DIAGNOSIS — R110 Nausea: Secondary | ICD-10-CM | POA: Diagnosis not present

## 2015-08-20 DIAGNOSIS — I10 Essential (primary) hypertension: Secondary | ICD-10-CM | POA: Diagnosis not present

## 2015-08-20 NOTE — Progress Notes (Signed)
   HPI  Patient presents today ere to discuss hypertension, muscle cramps, and prediabetes.  Prediabetes Watches her diet Gen. She understands.  Hypertension Average blood pressure prior to last week was ranging 140-150 overlow 80s. The week that she was seen, last week,she had a few high blood pressures as high as 182/69, 163/101,and the rest were 140s. She started HCTZ without any problems. Her blood pressure the last 2 days has been 120/60 No Chest pain, dizziness, headache,leg edema, palpitations.  muscle cramps Neck muscles, leg muscles, intermittent, without any obvious cause. Nausea For a few days has been coming on like a wave, no association with eating or abdominal pain.   PMH: Smoking status noted ROS: Per HPI  Objective: BP 134/66 mmHg  Pulse 70  Temp(Src) 96.9 F (36.1 C) (Oral)  Ht 5\' 3"  (1.6 m)  Wt 95 lb 3.2 oz (43.182 kg)  BMI 16.87 kg/m2 Gen: NAD, alert, cooperative with exam, very thin HEENT: NCAT,  CV: RRR, good S1/S2, no murmur Resp: CTABL, no wheezes, non-labored Abd: Soft, BS present, no guarding or organomegaly, scaphoid abd, very mild ttp in RUQ Ext: No edema, warm Neuro: Alert and oriented, No gross deficits  Assessment and plan:  # HTN Well controlled with addition of hctz Continue lisinopril and HCTZ BMP, considering cramps (occuring longer than new med)  # NAusea No clear etiology, Watchful waiting Reviewed red flags  # Pre-diabetes Discussed, plan Q6 month A1C, will do in 3 months to see trend then q 6 months She is very thin, i did not suggest very aggressive diet channges  #muscle cramps BMP No clear explanantion     Laroy Apple, MD [facility] 08/20/2015, 1:16 PM

## 2015-08-20 NOTE — Patient Instructions (Signed)
Great to see you!  Lets see you back in 3 months, to check your blood pressure and blood sugar  We will call within 1 week with your results

## 2015-08-21 LAB — CMP14+EGFR
A/G RATIO: 1.8 (ref 1.2–2.2)
ALK PHOS: 79 IU/L (ref 39–117)
ALT: 7 IU/L (ref 0–32)
AST: 14 IU/L (ref 0–40)
Albumin: 4.2 g/dL (ref 3.6–4.8)
BUN/Creatinine Ratio: 26 (ref 11–26)
BUN: 23 mg/dL (ref 8–27)
Bilirubin Total: 0.4 mg/dL (ref 0.0–1.2)
CHLORIDE: 100 mmol/L (ref 96–106)
CO2: 26 mmol/L (ref 18–29)
CREATININE: 0.88 mg/dL (ref 0.57–1.00)
Calcium: 9.6 mg/dL (ref 8.7–10.3)
GFR calc Af Amer: 79 mL/min/{1.73_m2} (ref >59)
GFR calc non Af Amer: 68 mL/min/{1.73_m2} (ref >59)
GLOBULIN, TOTAL: 2.4 g/dL (ref 1.5–4.5)
Glucose: 119 mg/dL — ABNORMAL HIGH (ref 65–99)
POTASSIUM: 4.6 mmol/L (ref 3.5–5.2)
SODIUM: 142 mmol/L (ref 134–144)
Total Protein: 6.6 g/dL (ref 6.0–8.5)

## 2015-10-03 ENCOUNTER — Other Ambulatory Visit: Payer: Self-pay | Admitting: Family Medicine

## 2015-10-05 ENCOUNTER — Ambulatory Visit (INDEPENDENT_AMBULATORY_CARE_PROVIDER_SITE_OTHER): Payer: Medicare Other | Admitting: Family Medicine

## 2015-10-05 ENCOUNTER — Encounter: Payer: Self-pay | Admitting: Family Medicine

## 2015-10-05 VITALS — BP 154/62 | HR 58 | Temp 97.2°F | Ht 63.0 in | Wt 97.2 lb

## 2015-10-05 DIAGNOSIS — S90425A Blister (nonthermal), left lesser toe(s), initial encounter: Secondary | ICD-10-CM

## 2015-10-05 MED ORDER — CEPHALEXIN 500 MG PO CAPS: 500.0000 mg | ORAL_CAPSULE | Freq: Three times a day (TID) | ORAL | Status: DC

## 2015-10-05 NOTE — Patient Instructions (Signed)
Great to see you!  Try the keflex, continue mupirocin  If it does not get better please come back.   If you suddenly have more pain, warmth, and spreading redness please let me know  If you develop other lesions we may need to get you to your dermatologist

## 2015-10-05 NOTE — Progress Notes (Signed)
   HPI  Patient presents today here with a blister.  Patient explains that she had a blister that started without obvious cause. It's on her left great toe. She denies any pain or she rubbing that would've caused it.  She neither any itching, irritation, or worsening of the lesion. Does seem slightly more red than it was yesterday today, however it is no larger, it is not warm, and it still is not tender or painful.  She does not have any fever, chills, sweats. She had a cyst removed from that area 30 years ago, she has never had a problem with it previously.   PMH: Smoking status noted ROS: Per HPI  Objective: BP 154/62 mmHg  Pulse 58  Temp(Src) 97.2 F (36.2 C) (Oral)  Ht 5\' 3"  (1.6 m)  Wt 97 lb 3.2 oz (44.09 kg)  BMI 17.22 kg/m2 Gen: NAD, alert, cooperative with exam HEENT: NCAT CV: RRR, good S1/S2, no murmur Resp: CTABL, no wheezes, non-labored Ext: No edema, warm Neuro: Alert and oriented, No gross deficits Skin: 2.5 cm x 1.3 cm area of erythema that is flat, nontender, nonindurated on left medial great toe Small erythematous papule at the proximal end of it, presently 1 mm in diameter No fluid apparent, no drainage  Assessment and plan:  # Blister Most likely friction blister versus arthropod bite No signs of serious disease No signs of severe infection or abscess Trial of Keflex, continue mupirocin return to clinic if not improving, worsening, or other concerns arise   Meds ordered this encounter  Medications  . cephALEXin (KEFLEX) 500 MG capsule    Sig: Take 1 capsule (500 mg total) by mouth 3 (three) times daily.    Dispense:  21 capsule    Refill:  0    Laroy Apple, MD [facility] 10/05/2015, 4:03 PM

## 2015-11-12 DIAGNOSIS — R922 Inconclusive mammogram: Secondary | ICD-10-CM | POA: Diagnosis not present

## 2015-11-24 ENCOUNTER — Ambulatory Visit: Payer: Medicare Other | Admitting: Family Medicine

## 2015-11-26 ENCOUNTER — Ambulatory Visit (INDEPENDENT_AMBULATORY_CARE_PROVIDER_SITE_OTHER): Payer: Medicare Other | Admitting: Family Medicine

## 2015-11-26 ENCOUNTER — Encounter: Payer: Self-pay | Admitting: Family Medicine

## 2015-11-26 VITALS — BP 141/59 | HR 58 | Temp 97.0°F | Ht 63.0 in | Wt 96.6 lb

## 2015-11-26 DIAGNOSIS — R7303 Prediabetes: Secondary | ICD-10-CM | POA: Diagnosis not present

## 2015-11-26 DIAGNOSIS — J309 Allergic rhinitis, unspecified: Secondary | ICD-10-CM

## 2015-11-26 DIAGNOSIS — I10 Essential (primary) hypertension: Secondary | ICD-10-CM | POA: Diagnosis not present

## 2015-11-26 LAB — BAYER DCA HB A1C WAIVED: HB A1C (BAYER DCA - WAIVED): 5.8 % (ref <7.0)

## 2015-11-26 MED ORDER — LISINOPRIL 20 MG PO TABS: 20.0000 mg | ORAL_TABLET | Freq: Every day | ORAL | Status: DC

## 2015-11-26 MED ORDER — HYDROCHLOROTHIAZIDE 25 MG PO TABS: 25.0000 mg | ORAL_TABLET | Freq: Every day | ORAL | Status: DC

## 2015-11-26 NOTE — Progress Notes (Signed)
   HPI  Patient presents today here to follow-up for hypertension, prediabetes, allergic rhinitis.  Hypertension Doing well at home, average systolic [id number] No chest pain, dyspnea, palpitations. Mild leg edema that's better by the morning.  Prediabetes Really watching her diet  Allergic rhinitis Complains of daily symptoms, cannot tolerate oral antihistamines due to insomnia. Using Alka-Seltzer almost every night.  PMH: Smoking status noted ROS: Per HPI  Objective: BP 141/59 mmHg  Pulse 58  Temp(Src) 97 F (36.1 C) (Oral)  Ht 5\' 3"  (1.6 m)  Wt 96 lb 9.6 oz (43.817 kg)  BMI 17.12 kg/m2 Gen: NAD, alert, cooperative with exam HEENT: NCAT, nares with swelling of the turbinates bilaterally CV: RRR, good S1/S2 Resp: CTABL, no wheezes, non-labored Ext: No edema, warm Neuro: Alert and oriented, No gross deficits  Assessment and plan:  # Hypertension Reasonably well controlled Refill HCTZ and lisinopril Labs due next March.  # Prediabetes A1c pending today   # Allergic rhinitis Some swelling of the turbinates bilaterally Recommended Nasacort    Orders Placed This Encounter  Procedures  . Bayer DCA Hb A1c Waived    Meds ordered this encounter  Medications  . hydrochlorothiazide (HYDRODIURIL) 25 MG tablet    Sig: Take 1 tablet (25 mg total) by mouth daily.    Dispense:  90 tablet    Refill:  3  . lisinopril (PRINIVIL,ZESTRIL) 20 MG tablet    Sig: Take 1 tablet (20 mg total) by mouth daily.    Dispense:  90 tablet    Refill:  [place], MD [facility] 11/26/2015, 8:48 AM

## 2015-11-26 NOTE — Patient Instructions (Signed)
Great to see you!  Try Nasocort for your nasal symptoms, 1 or 2 sprays once daily.   We will call with results of your finger prick test today  Lets plan to see you in 6 months

## 2015-12-15 ENCOUNTER — Encounter: Payer: Self-pay | Admitting: Family

## 2015-12-15 ENCOUNTER — Ambulatory Visit (INDEPENDENT_AMBULATORY_CARE_PROVIDER_SITE_OTHER): Payer: Medicare Other | Admitting: Family

## 2015-12-15 VITALS — BP 135/60 | HR 66 | Temp 97.0°F | Ht 63.0 in | Wt 96.4 lb

## 2015-12-15 DIAGNOSIS — R531 Weakness: Secondary | ICD-10-CM

## 2015-12-15 DIAGNOSIS — R251 Tremor, unspecified: Secondary | ICD-10-CM | POA: Diagnosis not present

## 2015-12-15 DIAGNOSIS — R5381 Other malaise: Secondary | ICD-10-CM | POA: Diagnosis not present

## 2015-12-15 DIAGNOSIS — R5383 Other fatigue: Secondary | ICD-10-CM

## 2015-12-15 DIAGNOSIS — R6889 Other general symptoms and signs: Secondary | ICD-10-CM | POA: Diagnosis not present

## 2015-12-15 NOTE — Patient Instructions (Signed)

## 2015-12-15 NOTE — Progress Notes (Signed)
   Subjective:    Patient ID: Pam Franklin, female    DOB: [birth year], 65 y.o.   MRN: [id number]  HPI PT presents to the office fatigue, achy, chills, and trembling. PT states this started yesterday and has become worse. PT denies any fever, UTI symptoms, nausea, vomiting, diarrhea, or cough.   Review of Systems  Constitutional: Positive for chills, appetite change and fatigue. Negative for unexpected weight change.  Respiratory: Positive for shortness of breath (at times).   All other systems reviewed and are negative.      Objective:   Physical Exam  Constitutional: She is oriented to person, place, and time. She appears well-developed and well-nourished. No distress.  HENT:  Head: Normocephalic and atraumatic.  Eyes: Pupils are equal, round, and reactive to light.  Neck: Normal range of motion. Neck supple. No thyromegaly present.  Cardiovascular: Normal rate, regular rhythm, normal heart sounds and intact distal pulses.   No murmur heard. Pulmonary/Chest: Effort normal and breath sounds normal. No respiratory distress. She has no wheezes.  Abdominal: Soft. Bowel sounds are normal. She exhibits no distension. There is no tenderness.  Musculoskeletal: Normal range of motion. She exhibits no edema or tenderness.  Neurological: She is alert and oriented to person, place, and time.  Skin: Skin is warm and dry.  Psychiatric: She has a normal mood and affect. Her behavior is normal. Judgment and thought content normal.  Vitals reviewed.     BP 135/60 mmHg  Pulse 66  Temp(Src) 97 F (36.1 C) (Oral)  Ht '5\' 3"'$  (1.6 m)  Wt 96 lb 6.4 oz (43.727 kg)  BMI 17.08 kg/m2     Assessment & Plan:  1. Other fatigue - Anemia Profile B - CMP14+EGFR  2. Weakness - Anemia Profile B - CMP14+EGFR  3. Episodes of trembling - Anemia Profile B - CMP14+EGFR  Rest Force fluids Bland diet Tylenol for aches and pain Labs pending RTO if symptoms worsen or do not improve  Evelina Dun, FNP

## 2015-12-16 LAB — ANEMIA PROFILE B
BASOS ABS: 0 10*3/uL (ref 0.0–0.2)
Basos: 1 %
EOS (ABSOLUTE): 0.1 10*3/uL (ref 0.0–0.4)
Eos: 2 %
Ferritin: 46 ng/mL (ref 15–150)
Hematocrit: 34.3 % (ref 34.0–46.6)
Hemoglobin: 11.6 g/dL (ref 11.1–15.9)
IMMATURE GRANS (ABS): 0 10*3/uL (ref 0.0–0.1)
IMMATURE GRANULOCYTES: 0 %
IRON: 58 ug/dL (ref 27–139)
Iron Saturation: 19 % (ref 15–55)
LYMPHS ABS: 2 10*3/uL (ref 0.7–3.1)
LYMPHS: 32 %
MCH: 31.2 pg (ref 26.6–33.0)
MCHC: 33.8 g/dL (ref 31.5–35.7)
MCV: 92 fL (ref 79–97)
MONOCYTES: 7 %
Monocytes Absolute: 0.5 10*3/uL (ref 0.1–0.9)
NEUTROS PCT: 58 %
Neutrophils Absolute: 3.6 10*3/uL (ref 1.4–7.0)
PLATELETS: 278 10*3/uL (ref 150–379)
RBC: 3.72 x10E6/uL — ABNORMAL LOW (ref 3.77–5.28)
RDW: 13.3 % (ref 12.3–15.4)
RETIC CT PCT: 0.6 % (ref 0.6–2.6)
TIBC: 312 ug/dL (ref 250–450)
UIBC: 254 ug/dL (ref 118–369)
Vitamin B-12: 208 pg/mL — ABNORMAL LOW (ref 211–946)
WBC: 6.2 10*3/uL (ref 3.4–10.8)

## 2015-12-16 LAB — CMP14+EGFR
ALBUMIN: 4.3 g/dL (ref 3.6–4.8)
ALK PHOS: 85 IU/L (ref 39–117)
ALT: 10 IU/L (ref 0–32)
AST: 20 IU/L (ref 0–40)
Albumin/Globulin Ratio: 1.8 (ref 1.2–2.2)
BUN / CREAT RATIO: 28 (ref 12–28)
BUN: 34 mg/dL — AB (ref 8–27)
Bilirubin Total: 0.6 mg/dL (ref 0.0–1.2)
CO2: 27 mmol/L (ref 18–29)
CREATININE: 1.2 mg/dL — AB (ref 0.57–1.00)
Calcium: 9.8 mg/dL (ref 8.7–10.3)
Chloride: 98 mmol/L (ref 96–106)
GFR calc Af Amer: 54 mL/min/{1.73_m2} — ABNORMAL LOW (ref >59)
GFR calc non Af Amer: 47 mL/min/{1.73_m2} — ABNORMAL LOW (ref >59)
GLUCOSE: 195 mg/dL — AB (ref 65–99)
Globulin, Total: 2.4 g/dL (ref 1.5–4.5)
Potassium: 4.6 mmol/L (ref 3.5–5.2)
Sodium: 143 mmol/L (ref 134–144)
Total Protein: 6.7 g/dL (ref 6.0–8.5)

## 2015-12-18 ENCOUNTER — Telehealth: Payer: Self-pay | Admitting: Family

## 2015-12-18 NOTE — Telephone Encounter (Signed)
Called about labs.

## 2016-01-25 DIAGNOSIS — S0501XA Injury of conjunctiva and corneal abrasion without foreign body, right eye, initial encounter: Secondary | ICD-10-CM | POA: Diagnosis not present

## 2016-01-25 DIAGNOSIS — H578 Other specified disorders of eye and adnexa: Secondary | ICD-10-CM | POA: Diagnosis not present

## 2016-01-27 DIAGNOSIS — H04123 Dry eye syndrome of bilateral lacrimal glands: Secondary | ICD-10-CM | POA: Diagnosis not present

## 2016-03-02 ENCOUNTER — Encounter: Payer: Self-pay | Admitting: *Deleted

## 2016-03-09 ENCOUNTER — Ambulatory Visit (INDEPENDENT_AMBULATORY_CARE_PROVIDER_SITE_OTHER): Payer: Medicare Other

## 2016-03-09 DIAGNOSIS — Z23 Encounter for immunization: Secondary | ICD-10-CM | POA: Diagnosis not present

## 2016-03-11 ENCOUNTER — Ambulatory Visit: Payer: Medicare Other

## 2016-05-12 ENCOUNTER — Encounter: Payer: Self-pay | Admitting: Family Medicine

## 2016-05-12 DIAGNOSIS — R921 Mammographic calcification found on diagnostic imaging of breast: Secondary | ICD-10-CM | POA: Diagnosis not present

## 2016-05-20 DIAGNOSIS — L820 Inflamed seborrheic keratosis: Secondary | ICD-10-CM | POA: Diagnosis not present

## 2016-05-20 DIAGNOSIS — L298 Other pruritus: Secondary | ICD-10-CM | POA: Diagnosis not present

## 2016-05-20 DIAGNOSIS — L718 Other rosacea: Secondary | ICD-10-CM | POA: Diagnosis not present

## 2016-05-20 DIAGNOSIS — L853 Xerosis cutis: Secondary | ICD-10-CM | POA: Diagnosis not present

## 2016-05-20 DIAGNOSIS — L570 Actinic keratosis: Secondary | ICD-10-CM | POA: Diagnosis not present

## 2016-05-25 DIAGNOSIS — H578 Other specified disorders of eye and adnexa: Secondary | ICD-10-CM | POA: Diagnosis not present

## 2016-05-25 DIAGNOSIS — S0501XA Injury of conjunctiva and corneal abrasion without foreign body, right eye, initial encounter: Secondary | ICD-10-CM | POA: Diagnosis not present

## 2016-05-27 ENCOUNTER — Ambulatory Visit: Payer: Medicare Other | Admitting: Family Medicine

## 2016-06-01 ENCOUNTER — Encounter: Payer: Self-pay | Admitting: Family Medicine

## 2016-06-01 ENCOUNTER — Ambulatory Visit (INDEPENDENT_AMBULATORY_CARE_PROVIDER_SITE_OTHER): Payer: Medicare Other | Admitting: Family Medicine

## 2016-06-01 VITALS — BP 152/64 | HR 62 | Temp 97.0°F | Ht 63.0 in | Wt 100.8 lb

## 2016-06-01 DIAGNOSIS — R7303 Prediabetes: Secondary | ICD-10-CM

## 2016-06-01 DIAGNOSIS — I10 Essential (primary) hypertension: Secondary | ICD-10-CM | POA: Diagnosis not present

## 2016-06-01 DIAGNOSIS — J3089 Other allergic rhinitis: Secondary | ICD-10-CM

## 2016-06-01 LAB — BAYER DCA HB A1C WAIVED: HB A1C: 5.7 % (ref <7.0)

## 2016-06-01 MED ORDER — LISINOPRIL 30 MG PO TABS: 30.0000 mg | ORAL_TABLET | Freq: Every day | ORAL | 3 refills | Status: DC

## 2016-06-01 NOTE — Patient Instructions (Signed)
Great to see you!  We will call with your labs within 1 week.

## 2016-06-01 NOTE — Progress Notes (Signed)
   HPI  Patient presents today here for follow-up chronic medical conditions.  Hypertension When she checks at home its average 841Y systolic. No headaches, chest pain, dyspnea, palpitations, or leg edema. Good medication compliance with no side effects.  Prediabetes Watching diet moderately Has a very active lifestyle at work but does not have a formal exercise routine.  Chronic rhinitis Patient with runny nose. Today she has developed some cold symptoms including headache and sore throat. However this is not very unusual for her. It has only happened this morning and has resolved since she woke up. She's tolerating food and fluids normally.    PMH: Smoking status noted ROS: Per HPI  Objective: BP (!) 152/64   Pulse 62   Temp 97 F (36.1 C) (Oral)   Ht '5\' 3"'$  (1.6 m)   Wt 100 lb 12.8 oz (45.7 kg)   BMI 17.86 kg/m  Gen: NAD, alert, cooperative with exam HEENT: NCAT CV: RRR, good S1/S2, no murmur Resp: CTABL, no wheezes, non-labored Ext: No edema, warm Neuro: Alert and oriented, No gross deficits  Assessment and plan:  # Hypertension Uncontrolled, however close to goal Increase lisinopril to 30 mg, continue 25 mg HCTZ Labs  # Prediabetes Likely stable, A1c pending, planning to check A1c every 6 months Discussed diet and exercise  # Chronic rhinitis Nonseasonal likely allergic rhinitis Recommended Flonase Temporary symptoms this morning are likely from either a developing cold or postnasal drip. Low threshold for return   Orders Placed This Encounter  Procedures  . CMP14+EGFR  . CBC with Differential/Platelet  . Bayer DCA Hb A1c Waived    Meds ordered this encounter  Medications  . lisinopril (PRINIVIL,ZESTRIL) 30 MG tablet    Sig: Take 1 tablet (30 mg total) by mouth daily.    Dispense:  90 tablet    Refill:  [place], MD [facility] 06/01/2016, 8:48 AM

## 2016-06-02 LAB — CMP14+EGFR
A/G RATIO: 1.8 (ref 1.2–2.2)
ALT: 8 IU/L (ref 0–32)
AST: 13 IU/L (ref 0–40)
Albumin: 4.2 g/dL (ref 3.6–4.8)
Alkaline Phosphatase: 69 IU/L (ref 39–117)
BILIRUBIN TOTAL: 0.4 mg/dL (ref 0.0–1.2)
BUN/Creatinine Ratio: 23 (ref 12–28)
BUN: 25 mg/dL (ref 8–27)
CALCIUM: 9.7 mg/dL (ref 8.7–10.3)
CHLORIDE: 100 mmol/L (ref 96–106)
CO2: 24 mmol/L (ref 18–29)
Creatinine, Ser: 1.09 mg/dL — ABNORMAL HIGH (ref 0.57–1.00)
GFR, EST AFRICAN AMERICAN: 61 mL/min/{1.73_m2} (ref >59)
GFR, EST NON AFRICAN AMERICAN: 53 mL/min/{1.73_m2} — AB (ref >59)
GLUCOSE: 103 mg/dL — AB (ref 65–99)
Globulin, Total: 2.4 g/dL (ref 1.5–4.5)
POTASSIUM: 4.6 mmol/L (ref 3.5–5.2)
Sodium: 143 mmol/L (ref 134–144)
TOTAL PROTEIN: 6.6 g/dL (ref 6.0–8.5)

## 2016-06-02 LAB — CBC WITH DIFFERENTIAL/PLATELET
BASOS ABS: 0 10*3/uL (ref 0.0–0.2)
Basos: 0 %
EOS (ABSOLUTE): 0.1 10*3/uL (ref 0.0–0.4)
Eos: 1 %
Hematocrit: 37 % (ref 34.0–46.6)
Hemoglobin: 11.9 g/dL (ref 11.1–15.9)
IMMATURE GRANS (ABS): 0 10*3/uL (ref 0.0–0.1)
IMMATURE GRANULOCYTES: 0 %
Lymphocytes Absolute: 1.3 10*3/uL (ref 0.7–3.1)
Lymphs: 18 %
MCH: 31.2 pg (ref 26.6–33.0)
MCHC: 32.2 g/dL (ref 31.5–35.7)
MCV: 97 fL (ref 79–97)
MONOCYTES: 8 %
Monocytes Absolute: 0.6 10*3/uL (ref 0.1–0.9)
NEUTROS PCT: 73 %
Neutrophils Absolute: 5.2 10*3/uL (ref 1.4–7.0)
PLATELETS: 248 10*3/uL (ref 150–379)
RBC: 3.82 x10E6/uL (ref 3.77–5.28)
RDW: 13.5 % (ref 12.3–15.4)
WBC: 7.2 10*3/uL (ref 3.4–10.8)

## 2016-06-24 DIAGNOSIS — H04123 Dry eye syndrome of bilateral lacrimal glands: Secondary | ICD-10-CM | POA: Diagnosis not present

## 2016-06-24 DIAGNOSIS — H40033 Anatomical narrow angle, bilateral: Secondary | ICD-10-CM | POA: Diagnosis not present

## 2016-10-07 ENCOUNTER — Other Ambulatory Visit: Payer: Self-pay | Admitting: Family Medicine

## 2016-11-10 ENCOUNTER — Encounter: Payer: Self-pay | Admitting: Cardiology

## 2016-11-10 ENCOUNTER — Ambulatory Visit (INDEPENDENT_AMBULATORY_CARE_PROVIDER_SITE_OTHER): Payer: Medicare Other | Admitting: Cardiology

## 2016-11-10 VITALS — BP 140/70 | HR 58 | Ht 63.0 in | Wt 103.8 lb

## 2016-11-10 DIAGNOSIS — I10 Essential (primary) hypertension: Secondary | ICD-10-CM | POA: Diagnosis not present

## 2016-11-10 DIAGNOSIS — I340 Nonrheumatic mitral (valve) insufficiency: Secondary | ICD-10-CM | POA: Diagnosis not present

## 2016-11-10 DIAGNOSIS — E441 Mild protein-calorie malnutrition: Secondary | ICD-10-CM | POA: Diagnosis not present

## 2016-11-10 DIAGNOSIS — R921 Mammographic calcification found on diagnostic imaging of breast: Secondary | ICD-10-CM | POA: Diagnosis not present

## 2016-11-10 NOTE — Patient Instructions (Signed)
Medication Instructions:  The current medical regimen is effective;  continue present plan and medications.  Follow-Up: Follow up in 2 years with Dr. Skains.  You will receive a letter in the mail 2 months before you are due.  Please call us when you receive this letter to schedule your follow up appointment.  If you need a refill on your cardiac medications before your next appointment, please call your pharmacy.  Thank you for choosing [facility]!!     
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
## 2017-03-10 ENCOUNTER — Ambulatory Visit (INDEPENDENT_AMBULATORY_CARE_PROVIDER_SITE_OTHER): Payer: Medicare Other

## 2017-03-10 DIAGNOSIS — Z23 Encounter for immunization: Secondary | ICD-10-CM | POA: Diagnosis not present

## 2017-03-13 ENCOUNTER — Ambulatory Visit: Payer: Medicare Other

## 2017-05-06 ENCOUNTER — Other Ambulatory Visit: Payer: Self-pay | Admitting: Family Medicine

## 2017-05-09 NOTE — Telephone Encounter (Signed)
Last seen 06/01/2016

## 2017-07-14 ENCOUNTER — Ambulatory Visit: Payer: Medicare Other | Admitting: Family Medicine

## 2017-07-21 ENCOUNTER — Ambulatory Visit (INDEPENDENT_AMBULATORY_CARE_PROVIDER_SITE_OTHER): Payer: Medicare Other | Admitting: Family Medicine

## 2017-07-21 ENCOUNTER — Encounter: Payer: Self-pay | Admitting: Family Medicine

## 2017-07-21 VITALS — BP 157/67 | HR 59 | Temp 97.3°F | Ht 63.0 in | Wt 111.8 lb

## 2017-07-21 DIAGNOSIS — I10 Essential (primary) hypertension: Secondary | ICD-10-CM

## 2017-07-21 DIAGNOSIS — E785 Hyperlipidemia, unspecified: Secondary | ICD-10-CM

## 2017-07-21 DIAGNOSIS — R7303 Prediabetes: Secondary | ICD-10-CM

## 2017-07-21 LAB — BAYER DCA HB A1C WAIVED: HB A1C (BAYER DCA - WAIVED): 5.6 % (ref <7.0)

## 2017-07-21 MED ORDER — HYDROCHLOROTHIAZIDE 25 MG PO TABS: 25.0000 mg | ORAL_TABLET | Freq: Every day | ORAL | 3 refills | Status: DC

## 2017-07-21 MED ORDER — LISINOPRIL 30 MG PO TABS: 30.0000 mg | ORAL_TABLET | Freq: Every day | ORAL | 3 refills | Status: DC

## 2017-07-21 MED ORDER — HYDRALAZINE HCL 10 MG PO TABS
10.0000 mg | ORAL_TABLET | Freq: Three times a day (TID) | ORAL | 3 refills | Status: DC | PRN
Start: 2017-07-21 — End: 2017-08-15

## 2017-07-21 NOTE — Progress Notes (Signed)
   HPI  Patient presents today here for follow-up hypertension.  Patient feels well and has no complaints. She states that at home her blood pressure is typically in the 130s over 60s. She is tolerating lisinopril and HCTZ well with no complaints. She does not want to take more blood pressure medicine.  Nursing has checked on the price of shingrix and it is not well covered today.  Prediabetes Patient is aware, watching diet moderately. Would like her A1c today  PMH: Smoking status noted ROS: Per HPI  Objective: BP (!) 157/67   Pulse (!) 59   Temp (!) 97.3 F (36.3 C) (Oral)   Ht '5\' 3"'$  (1.6 m)   Wt 111 lb 12.8 oz (50.7 kg)   LMP  (LMP Unknown)   BMI 19.80 kg/m  Gen: NAD, alert, cooperative with exam HEENT: NCAT CV: RRR, good S1/S2, no murmur Resp: CTABL, no wheezes, non-labored Ext: No edema, warm Neuro: Alert and oriented, No gross deficits  Assessment and plan:  #Hypertension Appears not well controlled here, however her blood pressures are controlled at home. No changes to 30 mg lisinopril +25 mg HCTZ Repeat labs   #pre-Diabetes Discussed, watching diet moderately. A1c pending   Hyperlipidemia No medications, fasting today Labs    Orders Placed This Encounter  Procedures  . CMP14+EGFR  . CBC with Differential/Platelet  . Lipid panel  . TSH  . Bayer DCA Hb A1c Waived    Meds ordered this encounter  Medications  . hydrochlorothiazide (HYDRODIURIL) 25 MG tablet    Sig: Take 1 tablet (25 mg total) by mouth daily.    Dispense:  90 tablet    Refill:  3  . lisinopril (PRINIVIL,ZESTRIL) 30 MG tablet    Sig: Take 1 tablet (30 mg total) by mouth daily.    Dispense:  90 tablet    Refill:  3  . hydrALAZINE (APRESOLINE) 10 MG tablet    Sig: Take 1 tablet (10 mg total) by mouth 3 (three) times daily as needed. For bp greater than 180    Dispense:  30 tablet    Refill:  [place], MD [facility] 07/21/2017, 9:05  AM

## 2017-07-22 LAB — CMP14+EGFR
ALBUMIN: 4.2 g/dL (ref 3.6–4.8)
ALK PHOS: 65 IU/L (ref 39–117)
ALT: 10 IU/L (ref 0–32)
AST: 12 IU/L (ref 0–40)
Albumin/Globulin Ratio: 1.7 (ref 1.2–2.2)
BUN / CREAT RATIO: 30 — AB (ref 12–28)
BUN: 32 mg/dL — AB (ref 8–27)
Bilirubin Total: 0.4 mg/dL (ref 0.0–1.2)
CALCIUM: 9.6 mg/dL (ref 8.7–10.3)
CO2: 24 mmol/L (ref 20–29)
CREATININE: 1.08 mg/dL — AB (ref 0.57–1.00)
Chloride: 103 mmol/L (ref 96–106)
GFR calc Af Amer: 61 mL/min/{1.73_m2} (ref >59)
GFR calc non Af Amer: 53 mL/min/{1.73_m2} — ABNORMAL LOW (ref >59)
GLOBULIN, TOTAL: 2.5 g/dL (ref 1.5–4.5)
GLUCOSE: 99 mg/dL (ref 65–99)
Potassium: 4.7 mmol/L (ref 3.5–5.2)
Sodium: 141 mmol/L (ref 134–144)
Total Protein: 6.7 g/dL (ref 6.0–8.5)

## 2017-07-22 LAB — CBC WITH DIFFERENTIAL/PLATELET
BASOS: 0 %
Basophils Absolute: 0 10*3/uL (ref 0.0–0.2)
EOS (ABSOLUTE): 0.1 10*3/uL (ref 0.0–0.4)
EOS: 2 %
HEMATOCRIT: 34.1 % (ref 34.0–46.6)
HEMOGLOBIN: 11.6 g/dL (ref 11.1–15.9)
IMMATURE GRANULOCYTES: 0 %
Immature Grans (Abs): 0 10*3/uL (ref 0.0–0.1)
LYMPHS ABS: 1.7 10*3/uL (ref 0.7–3.1)
Lymphs: 24 %
MCH: 31.4 pg (ref 26.6–33.0)
MCHC: 34 g/dL (ref 31.5–35.7)
MCV: 92 fL (ref 79–97)
MONOCYTES: 8 %
Monocytes Absolute: 0.6 10*3/uL (ref 0.1–0.9)
Neutrophils Absolute: 4.6 10*3/uL (ref 1.4–7.0)
Neutrophils: 66 %
Platelets: 248 10*3/uL (ref 150–379)
RBC: 3.69 x10E6/uL — AB (ref 3.77–5.28)
RDW: 13.3 % (ref 12.3–15.4)
WBC: 7 10*3/uL (ref 3.4–10.8)

## 2017-07-22 LAB — LIPID PANEL
CHOL/HDL RATIO: 2.5 ratio (ref 0.0–4.4)
Cholesterol, Total: 190 mg/dL (ref 100–199)
HDL: 77 mg/dL (ref >39)
LDL CALC: 105 mg/dL — AB (ref 0–99)
TRIGLYCERIDES: 41 mg/dL (ref 0–149)
VLDL Cholesterol Cal: 8 mg/dL (ref 5–40)

## 2017-07-22 LAB — TSH: TSH: 1.04 u[IU]/mL (ref 0.450–4.500)

## 2017-08-04 DIAGNOSIS — L718 Other rosacea: Secondary | ICD-10-CM | POA: Diagnosis not present

## 2017-08-04 DIAGNOSIS — L570 Actinic keratosis: Secondary | ICD-10-CM | POA: Diagnosis not present

## 2017-08-04 DIAGNOSIS — L821 Other seborrheic keratosis: Secondary | ICD-10-CM | POA: Diagnosis not present

## 2017-08-04 DIAGNOSIS — L918 Other hypertrophic disorders of the skin: Secondary | ICD-10-CM | POA: Diagnosis not present

## 2017-08-07 ENCOUNTER — Encounter: Payer: Self-pay | Admitting: Family Medicine

## 2017-08-07 ENCOUNTER — Ambulatory Visit (INDEPENDENT_AMBULATORY_CARE_PROVIDER_SITE_OTHER): Payer: Medicare Other | Admitting: Family Medicine

## 2017-08-07 VITALS — BP 126/67 | HR 79 | Temp 96.7°F | Ht 63.0 in | Wt 108.2 lb

## 2017-08-07 DIAGNOSIS — J988 Other specified respiratory disorders: Secondary | ICD-10-CM | POA: Diagnosis not present

## 2017-08-07 DIAGNOSIS — I10 Essential (primary) hypertension: Secondary | ICD-10-CM | POA: Diagnosis not present

## 2017-08-07 DIAGNOSIS — B9789 Other viral agents as the cause of diseases classified elsewhere: Secondary | ICD-10-CM

## 2017-08-07 DIAGNOSIS — F419 Anxiety disorder, unspecified: Secondary | ICD-10-CM

## 2017-08-07 DIAGNOSIS — F329 Major depressive disorder, single episode, unspecified: Secondary | ICD-10-CM | POA: Diagnosis not present

## 2017-08-07 DIAGNOSIS — F32A Depression, unspecified: Secondary | ICD-10-CM

## 2017-08-07 MED ORDER — ESCITALOPRAM OXALATE 10 MG PO TABS: 10.0000 mg | ORAL_TABLET | Freq: Every day | ORAL | 5 refills | Status: DC

## 2017-08-07 NOTE — Progress Notes (Signed)
   HPI  Patient presents today here for follow-up hypertension with anxiety.  Patient explains she has had depression and anxiety for about a year.  She states that she has multiple things going on in her life that she can control and she may have to make very big life changes. She is requesting Lexapro. Denies SI.  Patient states that she does not want anything addictive.  Patient has had blood pressures recently during episodes of anxiety as high as 186/112. She states that she feels bad during these times.  She has tried hydralazine which helps for the intermittent hypertension.  PMH: Smoking status noted ROS: Per HPI  Objective: BP 126/67   Pulse 79   Temp (!) 96.7 F (35.9 C) (Oral)   Ht 5\' 3"  (1.6 m)   Wt 108 lb 3.2 oz (49.1 kg)   LMP  (LMP Unknown)   SpO2 100%   BMI 19.17 kg/m  Gen: NAD, alert, cooperative with exam HEENT: NCAT, TMs normal bilaterally, oropharynx moist and clear CV: RRR, good S1/S2, no murmur Resp: CTABL, no wheezes, non-labored Ext: No edema, warm Neuro: Alert and oriented, No gross deficits  Assessment and plan:  #Anxiety and depression Starting Lexapro today Follow-up 3-4 weeks  #Hypertension Well controlled here today, intermittent elevations likely due to anxiety/panic attacks   #Viral respiratory illness Reassurance provided, supportive care Return to clinic with any concerns    Meds ordered this encounter  Medications  . escitalopram (LEXAPRO) 10 MG tablet    Sig: Take 1 tablet (10 mg total) by mouth daily.    Dispense:  30 tablet    Refill:  [place], MD [facility] 08/07/2017, 8:48 AM

## 2017-08-07 NOTE — Patient Instructions (Signed)
Great to see you!   

## 2017-08-09 ENCOUNTER — Encounter: Payer: Self-pay | Admitting: *Deleted

## 2017-08-09 ENCOUNTER — Ambulatory Visit (INDEPENDENT_AMBULATORY_CARE_PROVIDER_SITE_OTHER): Payer: Medicare Other | Admitting: *Deleted

## 2017-08-09 VITALS — BP 131/68 | HR 73 | Ht 63.0 in | Wt 107.0 lb

## 2017-08-09 DIAGNOSIS — Z0000 Encounter for general adult medical examination without abnormal findings: Secondary | ICD-10-CM

## 2017-08-09 NOTE — Patient Instructions (Signed)
Please work on increasing your water intake to 48-64 oz per day.   Please review the information given on Advanced Directives and if you complete these please bring our office a copy to be filed in your chart.  Thank you for coming in for your annual wellness visit today!   Preventive Care 63 Years and Older, Female Preventive care refers to lifestyle choices and visits with your health care provider that can promote health and wellness. What does preventive care include?  A yearly physical exam. This is also called an annual well check.  Dental exams once or twice a year.  Routine eye exams. Ask your health care provider how often you should have your eyes checked.  Personal lifestyle choices, including: ? Daily care of your teeth and gums. ? Regular physical activity. ? Eating a healthy diet. ? Avoiding tobacco and drug use. ? Limiting alcohol use. ? Practicing safe sex. ? Taking low-dose aspirin every day. ? Taking vitamin and mineral supplements as recommended by your health care provider. What happens during an annual well check? The services and screenings done by your health care provider during your annual well check will depend on your age, overall health, lifestyle risk factors, and family history of disease. Counseling Your health care provider may ask you questions about your:  Alcohol use.  Tobacco use.  Drug use.  Emotional well-being.  Home and relationship well-being.  Sexual activity.  Eating habits.  History of falls.  Memory and ability to understand (cognition).  Work and work Statistician.  Reproductive health.  Screening You may have the following tests or measurements:  Height, weight, and BMI.  Blood pressure.  Lipid and cholesterol levels. These may be checked every 5 years, or more frequently if you are over 41 years old.  Skin check.  Lung cancer screening. You may have this screening every year starting at age 37 if you have a  30-pack-year history of smoking and currently smoke or have quit within the past 15 years.  Fecal occult blood test (FOBT) of the stool. You may have this test every year starting at age 46.  Flexible sigmoidoscopy or colonoscopy. You may have a sigmoidoscopy every 5 years or a colonoscopy every 10 years starting at age 6.  Hepatitis C blood test.  Hepatitis B blood test.  Sexually transmitted disease (STD) testing.  Diabetes screening. This is done by checking your blood sugar (glucose) after you have not eaten for a while (fasting). You may have this done every 1-3 years.  Bone density scan. This is done to screen for osteoporosis. You may have this done starting at age 92.  Mammogram. This may be done every 1-2 years. Talk to your health care provider about how often you should have regular mammograms.  Talk with your health care provider about your test results, treatment options, and if necessary, the need for more tests. Vaccines Your health care provider may recommend certain vaccines, such as:  Influenza vaccine. This is recommended every year.  Tetanus, diphtheria, and acellular pertussis (Tdap, Td) vaccine. You may need a Td booster every 10 years.  Varicella vaccine. You may need this if you have not been vaccinated.  Zoster vaccine. You may need this after age 16.  Measles, mumps, and rubella (MMR) vaccine. You may need at least one dose of MMR if you were born in 1957 or later. You may also need a second dose.  Pneumococcal 13-valent conjugate (PCV13) vaccine. One dose is recommended after age  65.  Pneumococcal polysaccharide (PPSV23) vaccine. One dose is recommended after age 46.  Meningococcal vaccine. You may need this if you have certain conditions.  Hepatitis A vaccine. You may need this if you have certain conditions or if you travel or work in places where you may be exposed to hepatitis A.  Hepatitis B vaccine. You may need this if you have certain  conditions or if you travel or work in places where you may be exposed to hepatitis B.  Haemophilus influenzae type b (Hib) vaccine. You may need this if you have certain conditions.  Talk to your health care provider about which screenings and vaccines you need and how often you need them. This information is not intended to replace advice given to you by your health care provider. Make sure you discuss any questions you have with your health care provider. Document Released: 06/12/2015 Document Revised: 02/03/2016 Document Reviewed: 03/17/2015 Elsevier Interactive Patient Education  2018 [place] in the Home Falls can cause injuries. They can happen to people of all ages. There are many things you can do to make your home safe and to help prevent falls. What can I do on the outside of my home?  Regularly fix the edges of walkways and driveways and fix any cracks.  Remove anything that might make you trip as you walk through a door, such as a raised step or threshold.  Trim any bushes or trees on the path to your home.  Use bright outdoor lighting.  Clear any walking paths of anything that might make someone trip, such as rocks or tools.  Regularly check to see if handrails are loose or broken. Make sure that both sides of any steps have handrails.  Any raised decks and porches should have guardrails on the edges.  Have any leaves, snow, or ice cleared regularly.  Use sand or salt on walking paths during winter.  Clean up any spills in your garage right away. This includes oil or grease spills. What can I do in the bathroom?  Use night lights.  Install grab bars by the toilet and in the tub and shower. Do not use towel bars as grab bars.  Use non-skid mats or decals in the tub or shower.  If you need to sit down in the shower, use a plastic, non-slip stool.  Keep the floor dry. Clean up any water that spills on the floor as soon as it  happens.  Remove soap buildup in the tub or shower regularly.  Attach bath mats securely with double-sided non-slip rug tape.  Do not have throw rugs and other things on the floor that can make you trip. What can I do in the bedroom?  Use night lights.  Make sure that you have a light by your bed that is easy to reach.  Do not use any sheets or blankets that are too big for your bed. They should not hang down onto the floor.  Have a firm chair that has side arms. You can use this for support while you get dressed.  Do not have throw rugs and other things on the floor that can make you trip. What can I do in the kitchen?  Clean up any spills right away.  Avoid walking on wet floors.  Keep items that you use a lot in easy-to-reach places.  If you need to reach something above you, use a strong step stool that has a grab bar.  Keep electrical cords out of the way.  Do not use floor polish or wax that makes floors slippery. If you must use wax, use non-skid floor wax.  Do not have throw rugs and other things on the floor that can make you trip. What can I do with my stairs?  Do not leave any items on the stairs.  Make sure that there are handrails on both sides of the stairs and use them. Fix handrails that are broken or loose. Make sure that handrails are as long as the stairways.  Check any carpeting to make sure that it is firmly attached to the stairs. Fix any carpet that is loose or worn.  Avoid having throw rugs at the top or bottom of the stairs. If you do have throw rugs, attach them to the floor with carpet tape.  Make sure that you have a light switch at the top of the stairs and the bottom of the stairs. If you do not have them, ask someone to add them for you. What else can I do to help prevent falls?  Wear shoes that: ? Do not have high heels. ? Have rubber bottoms. ? Are comfortable and fit you well. ? Are closed at the toe. Do not wear sandals.  If you  use a stepladder: ? Make sure that it is fully opened. Do not climb a closed stepladder. ? Make sure that both sides of the stepladder are locked into place. ? Ask someone to hold it for you, if possible.  Clearly mark and make sure that you can see: ? Any grab bars or handrails. ? First and last steps. ? Where the edge of each step is.  Use tools that help you move around (mobility aids) if they are needed. These include: ? Canes. ? Walkers. ? Scooters. ? Crutches.  Turn on the lights when you go into a dark area. Replace any light bulbs as soon as they burn out.  Set up your furniture so you have a clear path. Avoid moving your furniture around.  If any of your floors are uneven, fix them.  If there are any pets around you, be aware of where they are.  Review your medicines with your doctor. Some medicines can make you feel dizzy. This can increase your chance of falling. Ask your doctor what other things that you can do to help prevent falls. This information is not intended to replace advice given to you by your health care provider. Make sure you discuss any questions you have with your health care provider. Document Released: 03/12/2009 Document Revised: 10/22/2015 Document Reviewed: 06/20/2014 Elsevier Interactive Patient Education  [employer].
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
## 2017-08-15 ENCOUNTER — Other Ambulatory Visit: Payer: Self-pay | Admitting: *Deleted

## 2017-08-15 MED ORDER — HYDRALAZINE HCL 10 MG PO TABS: 10.0000 mg | ORAL_TABLET | Freq: Three times a day (TID) | ORAL | 0 refills | Status: DC | PRN

## 2017-08-28 ENCOUNTER — Ambulatory Visit (INDEPENDENT_AMBULATORY_CARE_PROVIDER_SITE_OTHER): Payer: Medicare Other | Admitting: Family Medicine

## 2017-08-28 ENCOUNTER — Encounter: Payer: Self-pay | Admitting: Family Medicine

## 2017-08-28 VITALS — BP 155/61 | HR 60 | Temp 97.2°F | Ht 63.0 in | Wt 111.0 lb

## 2017-08-28 DIAGNOSIS — F32A Depression, unspecified: Secondary | ICD-10-CM | POA: Insufficient documentation

## 2017-08-28 DIAGNOSIS — F419 Anxiety disorder, unspecified: Secondary | ICD-10-CM

## 2017-08-28 DIAGNOSIS — M7989 Other specified soft tissue disorders: Secondary | ICD-10-CM | POA: Diagnosis not present

## 2017-08-28 DIAGNOSIS — F329 Major depressive disorder, single episode, unspecified: Secondary | ICD-10-CM

## 2017-08-28 NOTE — Progress Notes (Signed)
   HPI  Patient presents today for follow-up anxiety, also to discuss leg swelling.  Anxiety Patient states that she feels somewhat better. At first she took Lexapro in the morning and had fatigue.  She switch to nighttime dosing after about 3 days and states that she is tolerated better. No GI upset No other side effects, Feels like overall she is better.  Leg swelling 3-4 days of leg swelling right greater than left, slight calf tenderness bilaterally whenever this comes on. No diet changes Patient states that she may have been passing a kidney stone in the last 3 or 4 days as well, she has passed these previously and states that they normally passed without any intervention or medications. She states that she may have another one coming to pass currently and does not necessarily want any additional workup.  PMH: Smoking status noted ROS: Per HPI  Objective: BP (!) 155/61   Pulse 60   Temp (!) 97.2 F (36.2 C) (Oral)   Ht 5\' 3"  (1.6 m)   Wt 111 lb (50.3 kg)   LMP  (LMP Unknown)   BMI 19.66 kg/m  Gen: NAD, alert, cooperative with exam HEENT: NCAT CV: RRR, good S1/S2, no murmur Resp: CTABL, no wheezes, non-labored Ext: No edema, warm Neuro: Alert and oriented, No gross deficits  Assessment and plan:  #Anxiety and depression Slightly improved after only 3-4 weeks of Lexapro, continue, discussed will see full effects of medication in 4-8 weeks. Follow-up in 2 months  #Leg swelling New problem Not appreciated on exam today, however patient states that it usually comes on afternoon. Possible simple venous stasis No diet changes Possible fluid shifts and changes around possibly passing a kidney stone. Offered more support for kidney stone, she will call back if anything becomes unusual.    Laroy Apple, MD [facility] 08/28/2017, 8:54 AM

## 2017-08-30 ENCOUNTER — Other Ambulatory Visit: Payer: Self-pay | Admitting: *Deleted

## 2017-08-30 MED ORDER — ESCITALOPRAM OXALATE 10 MG PO TABS: 10.0000 mg | ORAL_TABLET | Freq: Every day | ORAL | 1 refills | Status: DC

## 2017-09-07 DIAGNOSIS — Z780 Asymptomatic menopausal state: Secondary | ICD-10-CM | POA: Diagnosis not present

## 2017-09-07 DIAGNOSIS — Z79890 Hormone replacement therapy (postmenopausal): Secondary | ICD-10-CM | POA: Diagnosis not present

## 2017-09-07 DIAGNOSIS — Z01419 Encounter for gynecological examination (general) (routine) without abnormal findings: Secondary | ICD-10-CM | POA: Diagnosis not present

## 2017-10-30 ENCOUNTER — Encounter: Payer: Self-pay | Admitting: Family Medicine

## 2017-10-30 ENCOUNTER — Ambulatory Visit (INDEPENDENT_AMBULATORY_CARE_PROVIDER_SITE_OTHER): Payer: Medicare Other | Admitting: Family Medicine

## 2017-10-30 VITALS — BP 131/70 | HR 59 | Temp 97.2°F | Ht 63.0 in | Wt 111.4 lb

## 2017-10-30 DIAGNOSIS — I10 Essential (primary) hypertension: Secondary | ICD-10-CM | POA: Diagnosis not present

## 2017-10-30 DIAGNOSIS — F419 Anxiety disorder, unspecified: Secondary | ICD-10-CM

## 2017-10-30 DIAGNOSIS — F329 Major depressive disorder, single episode, unspecified: Secondary | ICD-10-CM

## 2017-10-30 NOTE — Patient Instructions (Signed)
Great to see you!  Come back to see Dr. Warrick Parisian in 4 months  Your screening Hep C ab was negative on 07/31/2015

## 2017-10-30 NOTE — Progress Notes (Signed)
   HPI  Patient presents today for follow-up chronic medical conditions.  Anxiety depression Started Lexapro about 3 months ago, states she is doing well. No side effects at this time. She has somnolence at first, she switched the dosing to nighttime and is done much better.  Hypertension Good medication compliance No headaches or chest pain.  We discussed hepatitis C screening, hers is negative and March 2017.  PMH: Smoking status noted ROS: Per HPI  Objective: BP 131/70   Pulse (!) 59   Temp (!) 97.2 F (36.2 C) (Oral)   Ht 5\' 3"  (1.6 m)   Wt 111 lb 6.4 oz (50.5 kg)   LMP  (LMP Unknown)   BMI 19.73 kg/m  Gen: NAD, alert, cooperative with exam HEENT: NCAT CV: RRR, good S1/S2, no murmur Resp: CTABL, no wheezes, non-labored Ext: No edema, warm Neuro: Alert and oriented, No gross deficits  Assessment and plan:  #Anxiety depression Doing well with Lexapro, no changes   #Hypertension Well-controlled No changes   Follow-up in 3 to 4 months, consider A1c for prediabetes.  Laroy Apple, MD [facility] 10/30/2017, 8:32 AM

## 2017-11-17 DIAGNOSIS — Z1231 Encounter for screening mammogram for malignant neoplasm of breast: Secondary | ICD-10-CM | POA: Diagnosis not present

## 2018-01-05 ENCOUNTER — Other Ambulatory Visit: Payer: Self-pay | Admitting: Family

## 2018-01-05 ENCOUNTER — Other Ambulatory Visit: Payer: Self-pay

## 2018-01-05 DIAGNOSIS — Z1239 Encounter for other screening for malignant neoplasm of breast: Secondary | ICD-10-CM

## 2018-02-22 ENCOUNTER — Other Ambulatory Visit: Payer: Self-pay | Admitting: *Deleted

## 2018-02-22 MED ORDER — ESCITALOPRAM OXALATE 10 MG PO TABS: 10.0000 mg | ORAL_TABLET | Freq: Every day | ORAL | 0 refills | Status: DC

## 2018-02-22 NOTE — Telephone Encounter (Signed)
Ov 03/01/18

## 2018-03-01 ENCOUNTER — Ambulatory Visit (INDEPENDENT_AMBULATORY_CARE_PROVIDER_SITE_OTHER): Payer: Medicare Other | Admitting: Family Medicine

## 2018-03-01 ENCOUNTER — Encounter: Payer: Self-pay | Admitting: Family Medicine

## 2018-03-01 VITALS — BP 140/62 | HR 59 | Temp 97.2°F | Ht 63.0 in | Wt 112.4 lb

## 2018-03-01 DIAGNOSIS — E782 Mixed hyperlipidemia: Secondary | ICD-10-CM

## 2018-03-01 DIAGNOSIS — F329 Major depressive disorder, single episode, unspecified: Secondary | ICD-10-CM | POA: Diagnosis not present

## 2018-03-01 DIAGNOSIS — R7303 Prediabetes: Secondary | ICD-10-CM

## 2018-03-01 DIAGNOSIS — F419 Anxiety disorder, unspecified: Secondary | ICD-10-CM

## 2018-03-01 DIAGNOSIS — Z23 Encounter for immunization: Secondary | ICD-10-CM | POA: Diagnosis not present

## 2018-03-01 DIAGNOSIS — I10 Essential (primary) hypertension: Secondary | ICD-10-CM | POA: Diagnosis not present

## 2018-03-01 LAB — CMP14+EGFR
ALBUMIN: 4.5 g/dL (ref 3.6–4.8)
ALT: 11 IU/L (ref 0–32)
AST: 16 IU/L (ref 0–40)
Albumin/Globulin Ratio: 2.1 (ref 1.2–2.2)
Alkaline Phosphatase: 76 IU/L (ref 39–117)
BUN / CREAT RATIO: 22 (ref 12–28)
BUN: 25 mg/dL (ref 8–27)
Bilirubin Total: 0.4 mg/dL (ref 0.0–1.2)
CO2: 29 mmol/L (ref 20–29)
CREATININE: 1.15 mg/dL — AB (ref 0.57–1.00)
Calcium: 9.8 mg/dL (ref 8.7–10.3)
Chloride: 102 mmol/L (ref 96–106)
GFR calc non Af Amer: 49 mL/min/{1.73_m2} — ABNORMAL LOW (ref >59)
GFR, EST AFRICAN AMERICAN: 56 mL/min/{1.73_m2} — AB (ref >59)
Globulin, Total: 2.1 g/dL (ref 1.5–4.5)
Glucose: 96 mg/dL (ref 65–99)
Potassium: 4.3 mmol/L (ref 3.5–5.2)
Sodium: 141 mmol/L (ref 134–144)
TOTAL PROTEIN: 6.6 g/dL (ref 6.0–8.5)

## 2018-03-01 LAB — CBC WITH DIFFERENTIAL/PLATELET
Basophils Absolute: 0.1 10*3/uL (ref 0.0–0.2)
Basos: 1 %
EOS (ABSOLUTE): 0.2 10*3/uL (ref 0.0–0.4)
EOS: 3 %
HEMATOCRIT: 34.4 % (ref 34.0–46.6)
HEMOGLOBIN: 11.9 g/dL (ref 11.1–15.9)
IMMATURE GRANS (ABS): 0 10*3/uL (ref 0.0–0.1)
Immature Granulocytes: 0 %
LYMPHS ABS: 1.6 10*3/uL (ref 0.7–3.1)
Lymphs: 29 %
MCH: 31.4 pg (ref 26.6–33.0)
MCHC: 34.6 g/dL (ref 31.5–35.7)
MCV: 91 fL (ref 79–97)
MONOCYTES: 10 %
Monocytes Absolute: 0.6 10*3/uL (ref 0.1–0.9)
Neutrophils Absolute: 3.3 10*3/uL (ref 1.4–7.0)
Neutrophils: 57 %
Platelets: 269 10*3/uL (ref 150–450)
RBC: 3.79 x10E6/uL (ref 3.77–5.28)
RDW: 12 % — ABNORMAL LOW (ref 12.3–15.4)
WBC: 5.6 10*3/uL (ref 3.4–10.8)

## 2018-03-01 LAB — LIPID PANEL
CHOL/HDL RATIO: 2.6 ratio (ref 0.0–4.4)
Cholesterol, Total: 209 mg/dL — ABNORMAL HIGH (ref 100–199)
HDL: 81 mg/dL (ref >39)
LDL CALC: 118 mg/dL — AB (ref 0–99)
TRIGLYCERIDES: 51 mg/dL (ref 0–149)
VLDL CHOLESTEROL CAL: 10 mg/dL (ref 5–40)

## 2018-03-01 LAB — BAYER DCA HB A1C WAIVED: HB A1C (BAYER DCA - WAIVED): 5.9 % (ref <7.0)

## 2018-03-01 NOTE — Progress Notes (Signed)
BP 140/62   Pulse (!) 59   Temp (!) 97.2 F (36.2 C) (Oral)   Ht _0  (1.6 m)   Wt 112 lb 6.4 oz (51 kg)   LMP  (LMP Unknown)   BMI 19.91 kg/m    Subjective:    Patient ID: Pam Franklin, female    DOB: Feb 16, 1951, 67 y.o.   MRN: [id number]  HPI: Pam Franklin is a 67 y.o. female presenting on 03/01/2018 for Hypertension (4 month follow up); Hyperlipidemia; Establish Care Wendi Snipes pt); and Fatigue (x 2-3 weeks)   HPI Hypertension Patient is currently on hydrochlorothiazide and lisinopril, and their blood pressure today is 140/62. Patient denies any lightheadedness or dizziness. Patient denies headaches, blurred vision, chest pains, shortness of breath, or weakness. Denies any side effects from medication and is content with current medication.   Hyperlipidemia Patient is coming in for recheck of his hyperlipidemia. The patient is currently taking no medication and his diet controlled currently. They deny any issues with myalgias or history of liver damage from it. They deny any focal numbness or weakness or chest pain.   Prediabetes Patient comes in today for recheck of his diabetes. Patient has been currently taking cinnamon and diet control, will recheck A1c today. Patient is currently on an ACE inhibitor/ARB. Patient has not seen an ophthalmologist this year. Patient denies any issues with their feet.   Anxiety depression Patient is coming in to discuss anxiety and depression recheck.  She currently takes Lexapro and has been doing well on it and denies any major issues with it.  Patient denies any suicidal ideations and realistically denies any major anxiety or depression is pretty happy with life and where she is at.  Depression screen [facility] 2/9 03/01/2018 10/30/2017 08/28/2017 08/09/2017 08/07/2017  Decreased Interest 1 1 0 1 1  Down, Depressed, Hopeless _1 PHQ - 2 Score _2 Altered sleeping 1 1 - 1 2  Tired, decreased energy 1 1 - 1 2  Change in appetite 0 0 - 0 2    Feeling bad or failure about yourself  0 0 - 0 1  Trouble concentrating 0 0 - 0 1  Moving slowly or fidgety/restless 0 0 - 0 0  Suicidal thoughts 0 0 - 0 0  PHQ-9 Score 4 4 - 4 11  Difficult doing work/chores - - - Not difficult at all -     Relevant past medical, surgical, family and social history reviewed and updated as indicated. Interim medical history since our last visit reviewed. Allergies and medications reviewed and updated.  Review of Systems  Constitutional: Negative for chills and fever.  HENT: Negative for congestion, ear discharge and ear pain.   Eyes: Negative for redness and visual disturbance.  Respiratory: Negative for chest tightness and shortness of breath.   Cardiovascular: Negative for chest pain and leg swelling.  Genitourinary: Negative for difficulty urinating and dysuria.  Musculoskeletal: Negative for back pain and gait problem.  Skin: Negative for rash.  Neurological: Negative for light-headedness and headaches.  Psychiatric/Behavioral: Negative for agitation, behavioral problems, dysphoric mood, self-injury, sleep disturbance and suicidal ideas. The patient is not nervous/anxious.   All other systems reviewed and are negative.   Per HPI unless specifically indicated above   Allergies as of 03/01/2018      Reactions   Nitrofurantoin Nausea And Vomiting   REACTION: nausea and vomiting      Medication List  Accurate as of 03/01/18  8:15 AM. Always use your most recent med list.          aspirin 81 MG tablet Take 81 mg by mouth daily.   azelaic acid 20 % cream Commonly known as:  AZELEX Apply topically 2 (two) times daily. After skin is thoroughly washed and patted dry, gently but thoroughly massage a thin film of azelaic acid cream into the affected area twice daily, in the morning and evening.   BIOTIN 5000 5 MG Caps Generic drug:  Biotin Take 1 capsule by mouth daily.   Cinnamon 500 MG capsule Take 500 mg by mouth daily.    cycloSPORINE 0.05 % ophthalmic emulsion Commonly known as:  RESTASIS 1 drop 2 (two) times daily.   escitalopram 10 MG tablet Commonly known as:  LEXAPRO Take 1 tablet (10 mg total) by mouth daily.   estradiol 0.0375 MG/24HR Commonly known as:  VIVELLE-DOT Place 1 patch onto the skin 2 (two) times a week.   hydrALAZINE 10 MG tablet Commonly known as:  APRESOLINE Take 1 tablet (10 mg total) by mouth 3 (three) times daily as needed. For bp greater than 180   hydrochlorothiazide 25 MG tablet Commonly known as:  HYDRODIURIL Take 1 tablet (25 mg total) by mouth daily.   L-Lysine 1000 MG Tabs Take 1 tablet by mouth daily.   lisinopril 30 MG tablet Commonly known as:  PRINIVIL,ZESTRIL Take 1 tablet (30 mg total) by mouth daily.   Magnesium 250 MG Tabs Take 1 tablet by mouth daily.   ranitidine 75 MG tablet Commonly known as:  ZANTAC Take 75 mg by mouth daily.          Objective:    BP 140/62   Pulse (!) 59   Temp (!) 97.2 F (36.2 C) (Oral)   Ht _0  (1.6 m)   Wt 112 lb 6.4 oz (51 kg)   LMP  (LMP Unknown)   BMI 19.91 kg/m   Wt Readings from Last 3 Encounters:  03/01/18 112 lb 6.4 oz (51 kg)  10/30/17 111 lb 6.4 oz (50.5 kg)  08/28/17 111 lb (50.3 kg)    Physical Exam  Constitutional: She is oriented to person, place, and time. She appears well-developed and well-nourished. No distress.  Eyes: Pupils are equal, round, and reactive to light. Conjunctivae and EOM are normal.  Neck: Neck supple. No thyromegaly present.  Cardiovascular: Normal rate, regular rhythm, normal heart sounds and intact distal pulses.  No murmur heard. Pulmonary/Chest: Effort normal and breath sounds normal. No respiratory distress. She has no wheezes.  Musculoskeletal: Normal range of motion. She exhibits no edema or tenderness.  Lymphadenopathy:    She has no cervical adenopathy.  Neurological: She is alert and oriented to person, place, and time. Coordination normal.  Skin: Skin is  warm and dry. No rash noted. She is not diaphoretic.  Psychiatric: She has a normal mood and affect. Her behavior is normal. Judgment and thought content normal.  Nursing note and vitals reviewed.       Assessment & Plan:   Problem List Items Addressed This Visit      Cardiovascular and Mediastinum   Essential hypertension, benign - Primary   Relevant Orders   CMP14+EGFR (Completed)     Other   HLD (hyperlipidemia)   Relevant Orders   Lipid panel (Completed)   Prediabetes   Relevant Orders   Bayer DCA Hb A1c Waived (Completed)   CMP14+EGFR (Completed)   Anxiety and depression  Relevant Orders   CBC with Differential/Platelet (Completed)    Other Visit Diagnoses    Encounter for immunization       Relevant Orders   Flu vaccine HIGH DOSE PF (Completed)      Patient comes in with fatigue because she is not sleeping well, recommended melatonin  Follow up plan: Return in about 6 months (around 08/31/2018), or if symptoms worsen or fail to improve, for Hyperlipidemia and hypertension.  Counseling provided for all of the vaccine components Orders Placed This Encounter  Procedures  . Flu vaccine HIGH DOSE PF  . Bayer DCA Hb A1c Waived  . CMP14+EGFR  . CBC with Differential/Platelet  . Lipid panel    Caryl Pina, MD [facility] 03/01/2018, 8:15 AM

## 2018-03-03 DIAGNOSIS — H10013 Acute follicular conjunctivitis, bilateral: Secondary | ICD-10-CM | POA: Diagnosis not present

## 2018-05-10 DIAGNOSIS — R102 Pelvic and perineal pain: Secondary | ICD-10-CM | POA: Diagnosis not present

## 2018-05-24 DIAGNOSIS — R102 Pelvic and perineal pain: Secondary | ICD-10-CM | POA: Diagnosis not present

## 2018-06-03 ENCOUNTER — Other Ambulatory Visit: Payer: Self-pay | Admitting: Family Medicine

## 2018-06-04 NOTE — Telephone Encounter (Signed)
OV 03/01/18 rtc 6 mos

## 2018-06-08 ENCOUNTER — Ambulatory Visit (INDEPENDENT_AMBULATORY_CARE_PROVIDER_SITE_OTHER): Payer: Medicare Other

## 2018-06-08 ENCOUNTER — Encounter: Payer: Self-pay | Admitting: Physician Assistant

## 2018-06-08 ENCOUNTER — Ambulatory Visit (INDEPENDENT_AMBULATORY_CARE_PROVIDER_SITE_OTHER): Payer: Medicare Other | Admitting: Physician Assistant

## 2018-06-08 VITALS — BP 160/68 | HR 59 | Temp 96.6°F | Ht 63.0 in | Wt 116.8 lb

## 2018-06-08 DIAGNOSIS — M25552 Pain in left hip: Secondary | ICD-10-CM

## 2018-06-08 DIAGNOSIS — M1612 Unilateral primary osteoarthritis, left hip: Secondary | ICD-10-CM | POA: Diagnosis not present

## 2018-06-08 MED ORDER — PREDNISONE 10 MG (48) PO TBPK: ORAL_TABLET | ORAL | 0 refills | Status: DC
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
## 2018-06-29 ENCOUNTER — Encounter: Payer: Self-pay | Admitting: Nurse Practitioner

## 2018-07-03 ENCOUNTER — Ambulatory Visit (INDEPENDENT_AMBULATORY_CARE_PROVIDER_SITE_OTHER): Payer: Medicare Other | Admitting: Orthopaedic Surgery

## 2018-07-03 ENCOUNTER — Ambulatory Visit (INDEPENDENT_AMBULATORY_CARE_PROVIDER_SITE_OTHER): Payer: Medicare Other

## 2018-07-03 ENCOUNTER — Encounter (INDEPENDENT_AMBULATORY_CARE_PROVIDER_SITE_OTHER): Payer: Self-pay | Admitting: Orthopaedic Surgery

## 2018-07-03 VITALS — BP 139/59 | HR 73 | Resp 16 | Ht 63.0 in | Wt 115.0 lb

## 2018-07-03 DIAGNOSIS — I700 Atherosclerosis of aorta: Secondary | ICD-10-CM | POA: Diagnosis not present

## 2018-07-03 DIAGNOSIS — M5442 Lumbago with sciatica, left side: Secondary | ICD-10-CM
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
## 2018-07-03 NOTE — Patient Instructions (Addendum)

## 2018-07-06 ENCOUNTER — Other Ambulatory Visit (INDEPENDENT_AMBULATORY_CARE_PROVIDER_SITE_OTHER): Payer: Medicare Other

## 2018-07-06 ENCOUNTER — Ambulatory Visit (INDEPENDENT_AMBULATORY_CARE_PROVIDER_SITE_OTHER): Payer: Medicare Other | Admitting: Nurse Practitioner

## 2018-07-06 ENCOUNTER — Encounter: Payer: Self-pay | Admitting: Nurse Practitioner

## 2018-07-06 ENCOUNTER — Other Ambulatory Visit: Payer: Medicare Other

## 2018-07-06 VITALS — BP 122/60 | HR 64 | Ht 63.0 in | Wt 119.0 lb

## 2018-07-06 DIAGNOSIS — R1032 Left lower quadrant pain: Secondary | ICD-10-CM

## 2018-07-06 DIAGNOSIS — R194 Change in bowel habit: Secondary | ICD-10-CM

## 2018-07-06 LAB — CBC WITH DIFFERENTIAL/PLATELET
Basophils Absolute: 0 10*3/uL (ref 0.0–0.1)
Basophils Relative: 0.7 % (ref 0.0–3.0)
Eosinophils Absolute: 0.1 10*3/uL (ref 0.0–0.7)
Eosinophils Relative: 2.4 % (ref 0.0–5.0)
HEMATOCRIT: 37.3 % (ref 36.0–46.0)
HEMOGLOBIN: 12.6 g/dL (ref 12.0–15.0)
Lymphocytes Relative: 37.9 % (ref 12.0–46.0)
Lymphs Abs: 2 10*3/uL (ref 0.7–4.0)
MCHC: 33.7 g/dL (ref 30.0–36.0)
MCV: 94.5 fl (ref 78.0–100.0)
MONOS PCT: 9.7 % (ref 3.0–12.0)
Monocytes Absolute: 0.5 10*3/uL (ref 0.1–1.0)
Neutro Abs: 2.6 10*3/uL (ref 1.4–7.7)
Neutrophils Relative %: 49.3 % (ref 43.0–77.0)
Platelets: 273 10*3/uL (ref 150.0–400.0)
RBC: 3.95 Mil/uL (ref 3.87–5.11)
RDW: 13.1 % (ref 11.5–15.5)
WBC: 5.2 10*3/uL (ref 4.0–10.5)

## 2018-07-06 LAB — COMPREHENSIVE METABOLIC PANEL
ALT: 12 U/L (ref 0–35)
AST: 16 U/L (ref 0–37)
Albumin: 4.3 g/dL (ref 3.5–5.2)
Alkaline Phosphatase: 74 U/L (ref 39–117)
BUN: 24 mg/dL — ABNORMAL HIGH (ref 6–23)
CO2: 30 mEq/L (ref 19–32)
Calcium: 9.9 mg/dL (ref 8.4–10.5)
Chloride: 103 mEq/L (ref 96–112)
Creatinine, Ser: 1.12 mg/dL (ref 0.40–1.20)
GFR: 48.1 mL/min — AB (ref >60.00)
Glucose, Bld: 101 mg/dL — ABNORMAL HIGH (ref 70–99)
POTASSIUM: 4.2 meq/L (ref 3.5–5.1)
Sodium: 142 mEq/L (ref 135–145)
Total Bilirubin: 0.6 mg/dL (ref 0.2–1.2)
Total Protein: 6.9 g/dL (ref 6.0–8.3)

## 2018-07-06 LAB — IGA: IgA: 100 mg/dL (ref 68–378)

## 2018-07-06 LAB — C.DIFFICILE TOXIN: C. Difficile Toxin A: NOT DETECTED

## 2018-07-06 MED ORDER — METRONIDAZOLE 500 MG PO TABS: 500.0000 mg | ORAL_TABLET | Freq: Three times a day (TID) | ORAL | 0 refills | Status: DC

## 2018-07-06 NOTE — Patient Instructions (Addendum)
If you are age 70 or older, your body mass index should be between 23-30. Your Body mass index is 21.08 kg/m. If this is out of the aforementioned range listed, please consider follow up with your Primary Care Provider.  If you are age 57 or younger, your body mass index should be between 19-25. Your Body mass index is 21.08 kg/m. If this is out of the aformentioned range listed, please consider follow up with your Primary Care Provider.   Your provider has requested that you go to the basement level for lab work before leaving today. Press "B" on the elevator. The lab is located at the first door on the left as you exit the elevator.  We have sent the following medications to your pharmacy for you to pick up at your convenience: Flagyl  Follow up with me on July 19, 2018 at 9 am.  Thank you for choosing me and [facility].   Tye Savoy, NP

## 2018-07-06 NOTE — Progress Notes (Signed)
Reviewed and agree with initial management plan.  Wyeth Hoffer T. Katheleen Stella, MD FACG 
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
## 2018-07-09 LAB — FECAL LACTOFERRIN, QUANT
FECAL LACTOFERRIN: NEGATIVE
MICRO NUMBER:: 166782
SPECIMEN QUALITY:: ADEQUATE

## 2018-07-09 LAB — TISSUE TRANSGLUTAMINASE ABS,IGG,IGA
(tTG) Ab, IgA: 1 U/mL
(tTG) Ab, IgG: 1 U/mL

## 2018-07-17 ENCOUNTER — Other Ambulatory Visit: Payer: Self-pay | Admitting: *Deleted

## 2018-07-17 MED ORDER — LISINOPRIL 30 MG PO TABS: 30.0000 mg | ORAL_TABLET | Freq: Every day | ORAL | 0 refills | Status: DC

## 2018-07-19 ENCOUNTER — Ambulatory Visit (INDEPENDENT_AMBULATORY_CARE_PROVIDER_SITE_OTHER): Payer: Medicare Other | Admitting: Nurse Practitioner

## 2018-07-19 ENCOUNTER — Encounter: Payer: Self-pay | Admitting: Nurse Practitioner

## 2018-07-19 VITALS — BP 120/60 | HR 72 | Ht 63.0 in | Wt 119.4 lb

## 2018-07-19 DIAGNOSIS — R194 Change in bowel habit: Secondary | ICD-10-CM | POA: Diagnosis not present

## 2018-07-19 DIAGNOSIS — R143 Flatulence: Secondary | ICD-10-CM

## 2018-07-19 MED ORDER — VSL#3 PO PACK: PACK | ORAL | 0 refills | Status: DC

## 2018-07-19 NOTE — Patient Instructions (Signed)
If you are age 70 or older, your body mass index should be between 23-30. Your Body mass index is 21.15 kg/m. If this is out of the aforementioned range listed, please consider follow up with your Primary Care Provider.  If you are age 81 or younger, your body mass index should be between 19-25. Your Body mass index is 21.15 kg/m. If this is out of the aformentioned range listed, please consider follow up with your Primary Care Provider.   STOP Phillips Probiotic.  Start VSL#3 159mcg take 2 twice daily for 2 weeks, then take 2 daily for 30 days.  Call us with an update in three weeks.  Thank you for choosing me and [facility].   Tye Savoy, NP
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
## 2018-07-20 NOTE — Progress Notes (Signed)
Reviewed and agree with initial management plan. Low threshold to proceed with colonoscopy if symptoms do not resolve in next 3-4 weeks.   Pricilla Riffle. Fuller Plan, MD [facility]

## 2018-08-06 ENCOUNTER — Ambulatory Visit (INDEPENDENT_AMBULATORY_CARE_PROVIDER_SITE_OTHER): Payer: Medicare Other

## 2018-08-06 ENCOUNTER — Other Ambulatory Visit: Payer: Self-pay | Admitting: Family Medicine

## 2018-08-06 ENCOUNTER — Encounter: Payer: Self-pay | Admitting: *Deleted

## 2018-08-06 ENCOUNTER — Ambulatory Visit (INDEPENDENT_AMBULATORY_CARE_PROVIDER_SITE_OTHER): Payer: Medicare Other | Admitting: *Deleted

## 2018-08-06 VITALS — BP 139/68 | HR 62 | Ht 63.0 in | Wt 121.0 lb

## 2018-08-06 DIAGNOSIS — Z0000 Encounter for general adult medical examination without abnormal findings: Secondary | ICD-10-CM | POA: Diagnosis not present

## 2018-08-06 DIAGNOSIS — Z23 Encounter for immunization: Secondary | ICD-10-CM

## 2018-08-06 DIAGNOSIS — Z780 Asymptomatic menopausal state: Secondary | ICD-10-CM

## 2018-08-06 DIAGNOSIS — M85852 Other specified disorders of bone density and structure, left thigh: Secondary | ICD-10-CM | POA: Diagnosis not present

## 2018-08-06 NOTE — Patient Instructions (Signed)
Please work on your goal of increasing exercise to 3 times per week for 30 minutes - walking is a great option.   Please review the information given on Advance Directives.  If you complete the paperwork, please bring a copy to our office to be filed in your medical record.   Please continue to move carefully to avoid falls.   Please follow up with Dr. Warrick Parisian as scheduled.  You received the Pneumovax 23 (pneumonia) and Shingrix (Shingles) vaccines today.  You will need the 2nd dose of Shingrix after 10/06/2018.   Thank you for coming in for your Annual Wellness Visit today!  Preventive Care 70 Years and Older, Female Preventive care refers to lifestyle choices and visits with your health care provider that can promote health and wellness. What does preventive care include?  A yearly physical exam. This is also called an annual well check.  Dental exams once or twice a year.  Routine eye exams. Ask your health care provider how often you should have your eyes checked.  Personal lifestyle choices, including: ? Daily care of your teeth and gums. ? Regular physical activity. ? Eating a healthy diet. ? Avoiding tobacco and drug use. ? Limiting alcohol use. ? Practicing safe sex. ? Taking low-dose aspirin every day. ? Taking vitamin and mineral supplements as recommended by your health care provider. What happens during an annual well check? The services and screenings done by your health care provider during your annual well check will depend on your age, overall health, lifestyle risk factors, and family history of disease. Counseling Your health care provider may ask you questions about your:  Alcohol use.  Tobacco use.  Drug use.  Emotional well-being.  Home and relationship well-being.  Sexual activity.  Eating habits.  History of falls.  Memory and ability to understand (cognition).  Work and work Statistician.  Reproductive health.  Screening You may have  the following tests or measurements:  Height, weight, and BMI.  Blood pressure.  Lipid and cholesterol levels. These may be checked every 5 years, or more frequently if you are over 70 years old.  Skin check.  Lung cancer screening. You may have this screening every year starting at age 70 if you have a 30-pack-year history of smoking and currently smoke or have quit within the past 15 years.  Colorectal cancer screening. All adults should have this screening starting at age 70 and continuing until age 70. You will have tests every 1-10 years, depending on your results and the type of screening test. People at increased risk should start screening at an earlier age. Screening tests may include: ? Guaiac-based fecal occult blood testing. ? Fecal immunochemical test (FIT). ? Stool DNA test. ? Virtual colonoscopy. ? Sigmoidoscopy. During this test, a flexible tube with a tiny camera (sigmoidoscope) is used to examine your rectum and lower colon. The sigmoidoscope is inserted through your anus into your rectum and lower colon. ? Colonoscopy. During this test, a long, thin, flexible tube with a tiny camera (colonoscope) is used to examine your entire colon and rectum.  Hepatitis C blood test.  Hepatitis B blood test.  Sexually transmitted disease (STD) testing.  Diabetes screening. This is done by checking your blood sugar (glucose) after you have not eaten for a while (fasting). You may have this done every 1-3 years.  Bone density scan. This is done to screen for osteoporosis. You may have this done starting at age 70.  Mammogram. This may be done  every 1-2 years. Talk to your health care provider about how often you should have regular mammograms. Talk with your health care provider about your test results, treatment options, and if necessary, the need for more tests. Vaccines Your health care provider may recommend certain vaccines, such as:  Influenza vaccine. This is recommended  every year.  Tetanus, diphtheria, and acellular pertussis (Tdap, Td) vaccine. You may need a Td booster every 10 years.  Varicella vaccine. You may need this if you have not been vaccinated.  Zoster vaccine. You may need this after age 13.  Measles, mumps, and rubella (MMR) vaccine. You may need at least one dose of MMR if you were born in 1957 or later. You may also need a second dose.  Pneumococcal 13-valent conjugate (PCV13) vaccine. One dose is recommended after age 70.  Pneumococcal polysaccharide (PPSV23) vaccine. One dose is recommended after age 70.  Meningococcal vaccine. You may need this if you have certain conditions.  Hepatitis A vaccine. You may need this if you have certain conditions or if you travel or work in places where you may be exposed to hepatitis A.  Hepatitis B vaccine. You may need this if you have certain conditions or if you travel or work in places where you may be exposed to hepatitis B.  Haemophilus influenzae type b (Hib) vaccine. You may need this if you have certain conditions. Talk to your health care provider about which screenings and vaccines you need and how often you need them. This information is not intended to replace advice given to you by your health care provider. Make sure you discuss any questions you have with your health care provider. Document Released: 06/12/2015 Document Revised: 07/06/2017 Document Reviewed: 03/17/2015 Elsevier Interactive Patient Education  2019 [employer].
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
## 2018-08-09 ENCOUNTER — Ambulatory Visit (INDEPENDENT_AMBULATORY_CARE_PROVIDER_SITE_OTHER): Payer: Medicare Other | Admitting: Family

## 2018-08-09 ENCOUNTER — Encounter: Payer: Self-pay | Admitting: Family

## 2018-08-09 ENCOUNTER — Other Ambulatory Visit: Payer: Self-pay

## 2018-08-09 VITALS — BP 134/78 | HR 95 | Temp 98.4°F | Ht 63.0 in | Wt 120.2 lb

## 2018-08-09 DIAGNOSIS — J101 Influenza due to other identified influenza virus with other respiratory manifestations: Secondary | ICD-10-CM | POA: Diagnosis not present

## 2018-08-09 DIAGNOSIS — R509 Fever, unspecified: Secondary | ICD-10-CM | POA: Diagnosis not present

## 2018-08-09 LAB — VERITOR FLU A/B WAIVED
INFLUENZA A: POSITIVE — AB
Influenza B: NEGATIVE

## 2018-08-09 MED ORDER — OSELTAMIVIR PHOSPHATE 75 MG PO CAPS: 75.0000 mg | ORAL_CAPSULE | Freq: Two times a day (BID) | ORAL | 0 refills | Status: DC

## 2018-08-09 NOTE — Progress Notes (Signed)
Subjective:    Patient ID: Pam Franklin, female    DOB: 1951-05-30, 68 y.o.   MRN: [id number]  Chief Complaint  Patient presents with  . Sore Throat  . Headache  . Cough  . Fever  . Chills    Sore Throat   Associated symptoms include coughing and headaches. Pertinent negatives include no shortness of breath.  Cough  This is a new problem. The current episode started yesterday. The cough is non-productive. Associated symptoms include chills, a fever, headaches, myalgias, rhinorrhea and a sore throat. Pertinent negatives include no shortness of breath. She has tried rest (gargle) for the symptoms. The treatment provided no relief. There is no history of COPD.      Review of Systems  Constitutional: Positive for chills and fever.  HENT: Positive for rhinorrhea and sore throat.   Respiratory: Positive for cough. Negative for shortness of breath.   Musculoskeletal: Positive for myalgias.  Neurological: Positive for headaches.  All other systems reviewed and are negative.      Objective:   Physical Exam Vitals signs reviewed.  Constitutional:      General: She is not in acute distress.    Appearance: She is well-developed. She is ill-appearing.  HENT:     Head: Normocephalic and atraumatic.     Right Ear: External ear normal.     Nose: Congestion present.     Mouth/Throat:     Pharynx: Posterior oropharyngeal erythema present.  Eyes:     Pupils: Pupils are equal, round, and reactive to light.  Neck:     Musculoskeletal: Normal range of motion and neck supple.     Thyroid: No thyromegaly.  Cardiovascular:     Rate and Rhythm: Normal rate and regular rhythm.     Heart sounds: Normal heart sounds. No murmur.  Pulmonary:     Effort: Pulmonary effort is normal. No respiratory distress.     Breath sounds: Normal breath sounds. No wheezing.  Abdominal:     General: Bowel sounds are normal. There is no distension.     Palpations: Abdomen is soft.     Tenderness: There is  no abdominal tenderness.  Musculoskeletal: Normal range of motion.        General: No tenderness.  Skin:    General: Skin is warm and dry.  Neurological:     Mental Status: She is alert and oriented to person, place, and time.     Cranial Nerves: No cranial nerve deficit.     Deep Tendon Reflexes: Reflexes are normal and symmetric.  Psychiatric:        Behavior: Behavior normal.        Thought Content: Thought content normal.        Judgment: Judgment normal.       BP 134/78   Pulse 95   Temp 98.4 F (36.9 C) (Oral)   Ht 5\' 3"  (1.6 m)   Wt 120 lb 3.2 oz (54.5 kg)   LMP  (LMP Unknown)   BMI 21.29 kg/m      Assessment & Plan:  Pam Franklin comes in today with chief complaint of Sore Throat; Headache; Cough; Fever; and Chills   Diagnosis and orders addressed:  1. Fever, unspecified fever cause - Veritor Flu A/B Waived  2. Influenza A Rest Force fluids Droplet precautions discussed Tylenol as needed RTO if symptoms worsen or do not improve  - oseltamivir (TAMIFLU) 75 MG capsule; Take 1 capsule (75 mg total) by mouth 2 (two)  times daily.  Dispense: 10 capsule; Refill: 0   Evelina Dun, FNP

## 2018-08-21 DIAGNOSIS — L570 Actinic keratosis: Secondary | ICD-10-CM | POA: Diagnosis not present

## 2018-08-21 DIAGNOSIS — L718 Other rosacea: Secondary | ICD-10-CM | POA: Diagnosis not present

## 2018-08-21 DIAGNOSIS — D0439 Carcinoma in situ of skin of other parts of face: Secondary | ICD-10-CM | POA: Diagnosis not present

## 2018-08-21 DIAGNOSIS — D485 Neoplasm of uncertain behavior of skin: Secondary | ICD-10-CM | POA: Diagnosis not present

## 2018-08-31 ENCOUNTER — Other Ambulatory Visit: Payer: Self-pay

## 2018-08-31 ENCOUNTER — Ambulatory Visit (INDEPENDENT_AMBULATORY_CARE_PROVIDER_SITE_OTHER): Payer: Medicare Other | Admitting: Family Medicine

## 2018-08-31 ENCOUNTER — Encounter: Payer: Self-pay | Admitting: Family Medicine

## 2018-08-31 DIAGNOSIS — E782 Mixed hyperlipidemia: Secondary | ICD-10-CM | POA: Diagnosis not present

## 2018-08-31 DIAGNOSIS — F329 Major depressive disorder, single episode, unspecified: Secondary | ICD-10-CM

## 2018-08-31 DIAGNOSIS — R7303 Prediabetes: Secondary | ICD-10-CM

## 2018-08-31 DIAGNOSIS — F419 Anxiety disorder, unspecified: Secondary | ICD-10-CM

## 2018-08-31 DIAGNOSIS — I10 Essential (primary) hypertension: Secondary | ICD-10-CM | POA: Diagnosis not present

## 2018-08-31 MED ORDER — LISINOPRIL 30 MG PO TABS: 30.0000 mg | ORAL_TABLET | Freq: Every day | ORAL | 3 refills | Status: DC

## 2018-08-31 MED ORDER — HYDROCHLOROTHIAZIDE 25 MG PO TABS: 25.0000 mg | ORAL_TABLET | Freq: Every day | ORAL | 3 refills | Status: DC

## 2018-08-31 MED ORDER — ESCITALOPRAM OXALATE 10 MG PO TABS: 10.0000 mg | ORAL_TABLET | Freq: Every day | ORAL | 3 refills | Status: DC

## 2018-08-31 NOTE — Progress Notes (Signed)
Virtual Visit via telephone Note  I connected with Pam Franklin on 08/31/18 at [place] by telephone and verified that I am speaking with the correct person using two identifiers. Pam Franklin is currently located at home and no other people are currently with her during visit. The provider, Fransisca Kaufmann Julious Langlois, MD is located in their office at time of visit.  Call ended at [place]  I discussed the limitations, risks, security and privacy concerns of performing an evaluation and management service by telephone and the availability of in person appointments. I also discussed with the patient that there may be a patient responsible charge related to this service. The patient expressed understanding and agreed to proceed.   History and Present Illness: Hypertension Patient is currently on lisinopril and hydrochlorothiazide, and their blood pressure today at home 130/68. Patient denies any lightheadedness or dizziness. Patient denies headaches, blurred vision, chest pains, shortness of breath, or weakness. Denies any side effects from medication and is content with current medication.   Hyperlipidemia Patient is coming in for recheck of his hyperlipidemia. The patient is currently taking no medication currently. They deny any issues with myalgias or history of liver damage from it. They deny any focal numbness or weakness or chest pain.   Anxiety and Depression Patient says her anxiety is doing well and her depression is doing well on the escitalopram is working very well for her and she denies any major issues with it.  She denies any suicidal ideations or thoughts of hurting herself.  She is very content with where she is at  Prediabetes Patient comes in today for recheck of his diabetes. Patient has been currently taking medication. Patient is currently on an ACE inhibitor/ARB. Patient has not seen an ophthalmologist this year. Patient denies any issues with their feet.   No diagnosis found.   Outpatient Encounter Medications as of 08/31/2018  Medication Sig  . aspirin 81 MG tablet Take 81 mg by mouth daily.  Marland Kitchen azelaic acid (AZELEX) 20 % cream Apply topically 2 (two) times daily. After skin is thoroughly washed and patted dry, gently but thoroughly massage a thin film of azelaic acid cream into the affected area twice daily, in the morning and evening.  . Biotin (BIOTIN 5000) 5 MG CAPS Take 1 capsule by mouth daily.  . Cetirizine HCl (ZYRTEC ALLERGY) 10 MG CAPS Take by mouth.  . Cinnamon 500 MG capsule Take 500 mg by mouth daily.  . cycloSPORINE (RESTASIS) 0.05 % ophthalmic emulsion 1 drop 2 (two) times daily.  . Dietary Management Product (VSL#3) PACK Take 2 twice daily for 2 weeks then 2 daily for 30 days  . escitalopram (LEXAPRO) 10 MG tablet TAKE 1 TABLET BY MOUTH EVERY DAY  . estradiol (VIVELLE-DOT) 0.0375 MG/24HR Place 1 patch onto the skin 2 (two) times a week.  . hydrochlorothiazide (HYDRODIURIL) 25 MG tablet Take 1 tablet (25 mg total) by mouth daily.  Marland Kitchen L-Lysine 1000 MG TABS Take 1 tablet by mouth daily.  Marland Kitchen lisinopril (PRINIVIL,ZESTRIL) 30 MG tablet Take 1 tablet (30 mg total) by mouth daily.  . Magnesium 250 MG TABS Take 1 tablet by mouth daily.  Marland Kitchen oseltamivir (TAMIFLU) 75 MG capsule Take 1 capsule (75 mg total) by mouth 2 (two) times daily.   No facility-administered encounter medications on file as of 08/31/2018.     Review of Systems  Constitutional: Negative for chills and fever.  Eyes: Negative for visual disturbance.  Respiratory: Negative for chest tightness and  shortness of breath.   Cardiovascular: Positive for leg swelling. Negative for chest pain.  Genitourinary: Negative for difficulty urinating.  Musculoskeletal: Negative for back pain and gait problem.  Skin: Negative for rash.  Neurological: Negative for light-headedness and headaches.  Psychiatric/Behavioral: Negative for agitation and behavioral problems.  All other systems reviewed and are negative.    Observations/Objective: Patient sounds comfortable and in no acute distress found  Assessment and Plan: Problem List Items Addressed This Visit      Cardiovascular and Mediastinum   Essential hypertension, benign   Relevant Medications   hydrochlorothiazide (HYDRODIURIL) 25 MG tablet   lisinopril (PRINIVIL,ZESTRIL) 30 MG tablet     Other   HLD (hyperlipidemia)   Relevant Medications   hydrochlorothiazide (HYDRODIURIL) 25 MG tablet   lisinopril (PRINIVIL,ZESTRIL) 30 MG tablet   Prediabetes - Primary   Anxiety and depression   Relevant Medications   escitalopram (LEXAPRO) 10 MG tablet       Follow Up Instructions:  Has some swelling in legs but goes away at night, likely dependent edema and recommended that she possibly consider compression stockings  Continue current medications, it sounds like her blood pressures running good at home, we will check blood work in 6 months    I discussed the assessment and treatment plan with the patient. The patient was provided an opportunity to ask questions and all were answered. The patient agreed with the plan and demonstrated an understanding of the instructions.   The patient was advised to call back or seek an in-person evaluation if the symptoms worsen or if the condition fails to improve as anticipated.  The above assessment and management plan was discussed with the patient. The patient verbalized understanding of and has agreed to the management plan. Patient is aware to call the clinic if symptoms persist or worsen. Patient is aware when to return to the clinic for a follow-up visit. Patient educated on when it is appropriate to go to the emergency department.    I provided 10 minutes of non-face-to-face time during this encounter.    Worthy Rancher, MD

## 2018-09-24 ENCOUNTER — Encounter: Payer: Self-pay | Admitting: Family Medicine

## 2018-09-24 ENCOUNTER — Other Ambulatory Visit: Payer: Self-pay

## 2018-09-24 ENCOUNTER — Ambulatory Visit (INDEPENDENT_AMBULATORY_CARE_PROVIDER_SITE_OTHER): Payer: Medicare Other | Admitting: Family Medicine

## 2018-09-24 DIAGNOSIS — M85859 Other specified disorders of bone density and structure, unspecified thigh: Secondary | ICD-10-CM

## 2018-09-24 MED ORDER — CALCIUM CARBONATE-VITAMIN D 500-200 MG-UNIT PO TABS: 1.0000 | ORAL_TABLET | Freq: Every day | ORAL | 3 refills | Status: DC

## 2018-09-24 NOTE — Progress Notes (Signed)
Virtual Visit via telephone Note  I connected with Pam Franklin on 09/24/18 at 1001 by telephone and verified that I am speaking with the correct person using two identifiers. Pam Franklin is currently located at car and no other people are currently with her during visit. The provider, Fransisca Kaufmann Oris Calmes, MD is located in their office at time of visit.  Call ended at 1012  I discussed the limitations, risks, security and privacy concerns of performing an evaluation and management service by telephone and the availability of in person appointments. I also discussed with the patient that there may be a patient responsible charge related to this service. The patient expressed understanding and agreed to proceed.   History and Present Illness: Osteopenia Patient is calling in to discuss osteopenia.  She had a bone density scan that shows that she is osteopenic and had a T score of -2.3 which is slightly worse than her -2.2 over a couple years ago.  It is not significantly worse but is creeping closer to the -2.5 and we discussed the options with her calcium and vitamin D and continue with that and continue with impact and exercise and training versus doing treatment such as Fosamax or Prolia.  She says she would opt for trying for Prolia.  No diagnosis found.  Outpatient Encounter Medications as of 09/24/2018  Medication Sig  . aspirin 81 MG tablet Take 81 mg by mouth daily.  Marland Kitchen azelaic acid (AZELEX) 20 % cream Apply topically 2 (two) times daily. After skin is thoroughly washed and patted dry, gently but thoroughly massage a thin film of azelaic acid cream into the affected area twice daily, in the morning and evening.  . Biotin (BIOTIN 5000) 5 MG CAPS Take 1 capsule by mouth daily.  . Cetirizine HCl (ZYRTEC ALLERGY) 10 MG CAPS Take by mouth.  . Cinnamon 500 MG capsule Take 500 mg by mouth daily.  . cycloSPORINE (RESTASIS) 0.05 % ophthalmic emulsion 1 drop 2 (two) times daily.  . Dietary  Management Product (VSL#3) PACK Take 2 twice daily for 2 weeks then 2 daily for 30 days  . escitalopram (LEXAPRO) 10 MG tablet Take 1 tablet (10 mg total) by mouth daily.  Marland Kitchen estradiol (VIVELLE-DOT) 0.0375 MG/24HR Place 1 patch onto the skin 2 (two) times a week.  . hydrochlorothiazide (HYDRODIURIL) 25 MG tablet Take 1 tablet (25 mg total) by mouth daily.  . imiquimod (ALDARA) 5 % cream Apply topically 3 (three) times a week.  . L-Lysine 1000 MG TABS Take 1 tablet by mouth daily.  Marland Kitchen lisinopril (PRINIVIL,ZESTRIL) 30 MG tablet Take 1 tablet (30 mg total) by mouth daily.  . Magnesium 250 MG TABS Take 1 tablet by mouth daily.   No facility-administered encounter medications on file as of 09/24/2018.     Review of Systems  Constitutional: Negative for chills and fever.  Eyes: Negative for visual disturbance.  Respiratory: Negative for chest tightness and shortness of breath.   Cardiovascular: Negative for chest pain and leg swelling.  Musculoskeletal: Negative for back pain and gait problem.  Skin: Negative for rash.  Psychiatric/Behavioral: Negative for agitation and behavioral problems.  All other systems reviewed and are negative.   Observations/Objective: Patient sounds comfortable and in no acute distress  Assessment and Plan: Problem List Items Addressed This Visit      Musculoskeletal and Integument   Osteopenia - Primary   Relevant Medications   calcium-vitamin D (OSCAL WITH D) 500-200 MG-UNIT tablet  Follow Up Instructions: Follow-up as needed but will need bone density scan in 2, she will call back after the coronavirus is over to possibly discuss treatment for the osteopenia   Patient wants to consider Prolia in the future I discussed the assessment and treatment plan with the patient. The patient was provided an opportunity to ask questions and all were answered. The patient agreed with the plan and demonstrated an understanding of the instructions.   The patient  was advised to call back or seek an in-person evaluation if the symptoms worsen or if the condition fails to improve as anticipated.  The above assessment and management plan was discussed with the patient. The patient verbalized understanding of and has agreed to the management plan. Patient is aware to call the clinic if symptoms persist or worsen. Patient is aware when to return to the clinic for a follow-up visit. Patient educated on when it is appropriate to go to the emergency department.    I provided 11 minutes of non-face-to-face time during this encounter.    Worthy Rancher, MD

## 2018-10-05 ENCOUNTER — Other Ambulatory Visit: Payer: Self-pay

## 2018-10-08 ENCOUNTER — Ambulatory Visit (INDEPENDENT_AMBULATORY_CARE_PROVIDER_SITE_OTHER): Payer: Medicare Other | Admitting: *Deleted

## 2018-10-08 ENCOUNTER — Other Ambulatory Visit: Payer: Self-pay

## 2018-10-08 ENCOUNTER — Telehealth: Payer: Medicare Other | Admitting: Cardiology

## 2018-10-08 DIAGNOSIS — Z23 Encounter for immunization: Secondary | ICD-10-CM

## 2018-10-08 NOTE — Progress Notes (Signed)
Pt given 2nd Shingrix vaccine Tolerated well 

## 2018-10-10 ENCOUNTER — Telehealth (INDEPENDENT_AMBULATORY_CARE_PROVIDER_SITE_OTHER): Payer: Medicare Other | Admitting: Cardiology

## 2018-10-10 ENCOUNTER — Encounter: Payer: Self-pay | Admitting: Cardiology

## 2018-10-10 ENCOUNTER — Other Ambulatory Visit: Payer: Self-pay

## 2018-10-10 VITALS — BP 114/50 | Ht 63.0 in | Wt 114.0 lb

## 2018-10-10 DIAGNOSIS — I340 Nonrheumatic mitral (valve) insufficiency: Secondary | ICD-10-CM | POA: Diagnosis not present

## 2018-10-10 DIAGNOSIS — E441 Mild protein-calorie malnutrition: Secondary | ICD-10-CM

## 2018-10-10 DIAGNOSIS — I10 Essential (primary) hypertension: Secondary | ICD-10-CM

## 2018-10-10 NOTE — Patient Instructions (Signed)
Medication Instructions:  The current medical regimen is effective;  continue present plan and medications.  If you need a refill on your cardiac medications before your next appointment, please call your pharmacy.   Testing/Procedures: Your physician has requested that you have an echocardiogram at our [facility].  You will be contacted to be scheduled for this testing. Echocardiography is a painless test that uses sound waves to create images of your heart. It provides your doctor with information about the size and shape of your heart and how well your heart's chambers and valves are working. This procedure takes approximately one hour. There are no restrictions for this procedure.  We will notify you of the results once the test has been read.  Follow-Up: Follow up in 2 years with Dr. Marlou Porch.  You will receive a letter in the mail 2 months before you are due.  Please call us when you receive this letter to schedule your follow up appointment.  Thank you for choosing [facility]!!
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
## 2018-11-02 ENCOUNTER — Ambulatory Visit: Payer: Medicare Other | Admitting: Cardiology

## 2018-12-06 DIAGNOSIS — L718 Other rosacea: Secondary | ICD-10-CM | POA: Diagnosis not present

## 2018-12-06 DIAGNOSIS — Z85828 Personal history of other malignant neoplasm of skin: Secondary | ICD-10-CM | POA: Diagnosis not present

## 2018-12-06 DIAGNOSIS — L821 Other seborrheic keratosis: Secondary | ICD-10-CM | POA: Diagnosis not present

## 2018-12-21 ENCOUNTER — Other Ambulatory Visit: Payer: Self-pay

## 2018-12-21 ENCOUNTER — Ambulatory Visit (HOSPITAL_COMMUNITY): Payer: Medicare Other | Attending: Cardiology

## 2018-12-21 DIAGNOSIS — I340 Nonrheumatic mitral (valve) insufficiency: Secondary | ICD-10-CM | POA: Diagnosis not present

## 2018-12-25 ENCOUNTER — Telehealth: Payer: Self-pay | Admitting: Cardiology

## 2018-12-25 NOTE — Telephone Encounter (Signed)
New Message    Patient returning Carol's call for echo results.

## 2018-12-25 NOTE — Telephone Encounter (Signed)
Notes recorded by Jerline Pain, MD on 12/24/2018 at 7:15 AM EDT  Mild mitral regurgitation.  Normal pump function.  Reassuring.  Candee Furbish, MD   Pt aware of echo results.

## 2018-12-28 DIAGNOSIS — Z1231 Encounter for screening mammogram for malignant neoplasm of breast: Secondary | ICD-10-CM | POA: Diagnosis not present

## 2019-01-16 DIAGNOSIS — N952 Postmenopausal atrophic vaginitis: Secondary | ICD-10-CM | POA: Diagnosis not present

## 2019-01-16 DIAGNOSIS — Z780 Asymptomatic menopausal state: Secondary | ICD-10-CM | POA: Diagnosis not present

## 2019-01-16 DIAGNOSIS — Z79890 Hormone replacement therapy (postmenopausal): Secondary | ICD-10-CM | POA: Diagnosis not present

## 2019-03-07 ENCOUNTER — Ambulatory Visit (INDEPENDENT_AMBULATORY_CARE_PROVIDER_SITE_OTHER): Payer: Medicare Other | Admitting: *Deleted

## 2019-03-07 ENCOUNTER — Other Ambulatory Visit: Payer: Self-pay

## 2019-03-07 DIAGNOSIS — Z23 Encounter for immunization: Secondary | ICD-10-CM

## 2019-04-29 ENCOUNTER — Encounter: Payer: Self-pay | Admitting: Gastroenterology

## 2019-06-05 ENCOUNTER — Other Ambulatory Visit: Payer: Self-pay | Admitting: Family Medicine

## 2019-07-01 ENCOUNTER — Other Ambulatory Visit: Payer: Self-pay | Admitting: Family Medicine

## 2019-07-01 MED ORDER — HYDROCHLOROTHIAZIDE 25 MG PO TABS: 25.0000 mg | ORAL_TABLET | Freq: Every day | ORAL | 0 refills | Status: DC

## 2019-07-01 NOTE — Telephone Encounter (Signed)
Dettinger. NTBS 30 days given 06/05/19

## 2019-07-01 NOTE — Telephone Encounter (Signed)
Appointment given for 02/26. One refill given.

## 2019-07-01 NOTE — Addendum Note (Signed)
Addended by: Thana Ates on: 07/01/2019 04:22 PM   Modules accepted: Orders

## 2019-07-25 ENCOUNTER — Other Ambulatory Visit: Payer: Self-pay

## 2019-07-26 ENCOUNTER — Ambulatory Visit (INDEPENDENT_AMBULATORY_CARE_PROVIDER_SITE_OTHER): Payer: Medicare Other | Admitting: Family Medicine

## 2019-07-26 ENCOUNTER — Encounter: Payer: Self-pay | Admitting: Family Medicine

## 2019-07-26 VITALS — BP 143/66 | HR 61 | Temp 98.4°F | Ht 63.0 in | Wt 125.4 lb

## 2019-07-26 DIAGNOSIS — E782 Mixed hyperlipidemia: Secondary | ICD-10-CM

## 2019-07-26 DIAGNOSIS — F419 Anxiety disorder, unspecified: Secondary | ICD-10-CM

## 2019-07-26 DIAGNOSIS — I10 Essential (primary) hypertension: Secondary | ICD-10-CM

## 2019-07-26 DIAGNOSIS — F329 Major depressive disorder, single episode, unspecified: Secondary | ICD-10-CM

## 2019-07-26 DIAGNOSIS — R7303 Prediabetes: Secondary | ICD-10-CM

## 2019-07-26 LAB — BAYER DCA HB A1C WAIVED: HB A1C (BAYER DCA - WAIVED): 5.8 % (ref <7.0)

## 2019-07-26 MED ORDER — ESCITALOPRAM OXALATE 10 MG PO TABS: 10.0000 mg | ORAL_TABLET | Freq: Every day | ORAL | 3 refills | Status: DC

## 2019-07-26 MED ORDER — HYDROCHLOROTHIAZIDE 25 MG PO TABS: 25.0000 mg | ORAL_TABLET | Freq: Every day | ORAL | 3 refills | Status: DC

## 2019-07-26 MED ORDER — LISINOPRIL 30 MG PO TABS: 30.0000 mg | ORAL_TABLET | Freq: Every day | ORAL | 3 refills | Status: DC

## 2019-07-26 NOTE — Progress Notes (Signed)
BP (!) 143/66   Pulse 61   Temp 98.4 F (36.9 C) (Temporal)   Ht 5' 3" (1.6 m)   Wt 125 lb 6.4 oz (56.9 kg)   LMP  (LMP Unknown)   SpO2 100%   BMI 22.21 kg/m    Subjective:   Patient ID: Pam Franklin, female    DOB: Sep 29, 1950, 69 y.o.   MRN: [id number]  HPI: Pam Franklin is a 69 y.o. female presenting on 07/26/2019 for Hypertension (6 month follow up )   HPI Hypertension Patient is currently on lisinopril and hydrochlorothiazide, and their blood pressure today is 143/66. Patient denies any lightheadedness or dizziness. Patient denies headaches, blurred vision, chest pains, shortness of breath, or weakness. Denies any side effects from medication and is content with current medication.   Hyperlipidemia Patient is coming in for recheck of his hyperlipidemia. The patient is currently taking no medication. They deny any issues with myalgias or history of liver damage from it. They deny any focal numbness or weakness or chest pain.   Prediabetes Patient comes in today for recheck of his diabetes. Patient has been currently taking no medication is diet controlled. Patient is currently on an ACE inhibitor/ARB. Patient has not seen an ophthalmologist this year. Patient denies any issues with their feet.   Depression and anxiety recheck Patient is coming for anxiety and depression recheck.  She is currently on Lexapro and says that it is working well.  Patient denies any suicidal ideations or thoughts of hurting self  Relevant past medical, surgical, family and social history reviewed and updated as indicated. Interim medical history since our last visit reviewed. Allergies and medications reviewed and updated.  Review of Systems  Constitutional: Negative for chills and fever.  HENT: Negative for congestion, ear discharge, ear pain, sinus pressure, sinus pain, sneezing and sore throat.   Eyes: Negative for redness and visual disturbance.  Respiratory: Negative for cough, chest  tightness and shortness of breath.   Cardiovascular: Negative for chest pain and leg swelling.  Musculoskeletal: Negative for back pain and gait problem.  Skin: Negative for rash.  Neurological: Positive for dizziness. Negative for weakness, numbness and headaches.  Psychiatric/Behavioral: Negative for agitation and behavioral problems.  All other systems reviewed and are negative.   Per HPI unless specifically indicated above   Allergies as of 07/26/2019      Reactions   Nitrofurantoin Nausea And Vomiting   REACTION: nausea and vomiting   Prednisone Nausea Only      Medication List       Accurate as of July 26, 2019 12:00 PM. If you have any questions, ask your nurse or doctor.        STOP taking these medications   amoxicillin 500 MG capsule Commonly known as: AMOXIL Stopped by: Fransisca Kaufmann Millenia Waldvogel, MD     TAKE these medications   aspirin 81 MG tablet Take 81 mg by mouth daily.   azelaic acid 20 % cream Commonly known as: AZELEX Apply topically 2 (two) times daily. After skin is thoroughly washed and patted dry, gently but thoroughly massage a thin film of azelaic acid cream into the affected area twice daily, in the morning and evening.   Biotin 5000 5 MG Caps Generic drug: Biotin Take 1 capsule by mouth daily.   calcium-vitamin D 500-200 MG-UNIT tablet Commonly known as: OSCAL WITH D Take 1 tablet by mouth daily with breakfast.   Cinnamon 500 MG capsule Take 500 mg by mouth daily.  cycloSPORINE 0.05 % ophthalmic emulsion Commonly known as: RESTASIS 1 drop 2 (two) times daily.   escitalopram 10 MG tablet Commonly known as: LEXAPRO Take 1 tablet (10 mg total) by mouth daily.   estradiol 0.0375 MG/24HR Commonly known as: VIVELLE-DOT Place 1 patch onto the skin 2 (two) times a week.   hydrochlorothiazide 25 MG tablet Commonly known as: HYDRODIURIL Take 1 tablet (25 mg total) by mouth daily. What changed: additional instructions Changed by: Fransisca Kaufmann Britlee Skolnik, MD   L-Lysine 1000 MG Tabs Take 1 tablet by mouth daily.   lisinopril 30 MG tablet Commonly known as: ZESTRIL Take 1 tablet (30 mg total) by mouth daily.   Magnesium 250 MG Tabs Take 1 tablet by mouth daily.   ZyrTEC Allergy 10 MG Caps Generic drug: Cetirizine HCl Take 10 mg by mouth daily.        Objective:   BP (!) 143/66   Pulse 61   Temp 98.4 F (36.9 C) (Temporal)   Ht 5' 3" (1.6 m)   Wt 125 lb 6.4 oz (56.9 kg)   LMP  (LMP Unknown)   SpO2 100%   BMI 22.21 kg/m   Wt Readings from Last 3 Encounters:  07/26/19 125 lb 6.4 oz (56.9 kg)  10/10/18 114 lb (51.7 kg)  08/09/18 120 lb 3.2 oz (54.5 kg)    Physical Exam Vitals and nursing note reviewed.  Constitutional:      General: She is not in acute distress.    Appearance: She is well-developed. She is not diaphoretic.  Eyes:     Conjunctiva/sclera: Conjunctivae normal.  Cardiovascular:     Rate and Rhythm: Normal rate and regular rhythm.     Heart sounds: Normal heart sounds. No murmur.  Pulmonary:     Effort: Pulmonary effort is normal. No respiratory distress.     Breath sounds: Normal breath sounds. No wheezing.  Musculoskeletal:        General: No tenderness. Normal range of motion.  Skin:    General: Skin is warm and dry.     Findings: No rash.  Neurological:     Mental Status: She is alert and oriented to person, place, and time.     Coordination: Coordination normal.  Psychiatric:        Mood and Affect: Mood is anxious and depressed.        Behavior: Behavior normal.       Assessment & Plan:   Problem List Items Addressed This Visit      Cardiovascular and Mediastinum   Essential hypertension, benign   Relevant Medications   lisinopril (ZESTRIL) 30 MG tablet   Other Relevant Orders   CMP14+EGFR (Completed)     Other   HLD (hyperlipidemia)   Relevant Medications   lisinopril (ZESTRIL) 30 MG tablet   Other Relevant Orders   CBC with Differential/Platelet (Completed)    Lipid panel (Completed)   Prediabetes - Primary   Relevant Orders   CBC with Differential/Platelet (Completed)   CMP14+EGFR (Completed)   Bayer DCA Hb A1c Waived (Completed)   Anxiety and depression   Relevant Medications   escitalopram (LEXAPRO) 10 MG tablet   Other Relevant Orders   CBC with Differential/Platelet (Completed)      Patient describes a little bit of balance issues especially when she squats down and then stands up but not all the time, we tried to reproduce it in the office and it did not happen.  She says she does not feel lightheaded or  like her blood pressures down or faint but she just feels off balance.  She will keep a log book on it and let us know if what is been happening or what is associated with when it happens and see if we can figure it out. Follow up plan: Return in about 6 months (around 01/23/2020), or if symptoms worsen or fail to improve, for Hypertension recheck.  Counseling provided for all of the vaccine components Orders Placed This Encounter  Procedures  . CBC with Differential/Platelet  . CMP14+EGFR  . Lipid panel  . Bayer [facility] Hb A1c [place], MD [facility] 07/26/2019, 12:00 PM

## 2019-07-27 LAB — CBC WITH DIFFERENTIAL/PLATELET
Basophils Absolute: 0.1 10*3/uL (ref 0.0–0.2)
Basos: 1 %
EOS (ABSOLUTE): 0.1 10*3/uL (ref 0.0–0.4)
Eos: 2 %
Hematocrit: 35.4 % (ref 34.0–46.6)
Hemoglobin: 11.9 g/dL (ref 11.1–15.9)
Immature Grans (Abs): 0 10*3/uL (ref 0.0–0.1)
Immature Granulocytes: 0 %
Lymphocytes Absolute: 1.9 10*3/uL (ref 0.7–3.1)
Lymphs: 32 %
MCH: 31.8 pg (ref 26.6–33.0)
MCHC: 33.6 g/dL (ref 31.5–35.7)
MCV: 95 fL (ref 79–97)
Monocytes Absolute: 0.5 10*3/uL (ref 0.1–0.9)
Monocytes: 8 %
Neutrophils Absolute: 3.3 10*3/uL (ref 1.4–7.0)
Neutrophils: 57 %
Platelets: 254 10*3/uL (ref 150–450)
RBC: 3.74 x10E6/uL — ABNORMAL LOW (ref 3.77–5.28)
RDW: 12 % (ref 11.7–15.4)
WBC: 5.9 10*3/uL (ref 3.4–10.8)

## 2019-07-27 LAB — CMP14+EGFR
ALT: 9 IU/L (ref 0–32)
AST: 15 IU/L (ref 0–40)
Albumin/Globulin Ratio: 2 (ref 1.2–2.2)
Albumin: 4.2 g/dL (ref 3.7–4.7)
Alkaline Phosphatase: 79 IU/L (ref 39–117)
BUN/Creatinine Ratio: 20 (ref 12–28)
BUN: 22 mg/dL (ref 8–27)
Bilirubin Total: 0.5 mg/dL (ref 0.0–1.2)
CO2: 26 mmol/L (ref 20–29)
Calcium: 9.7 mg/dL (ref 8.7–10.3)
Chloride: 105 mmol/L (ref 96–106)
Creatinine, Ser: 1.08 mg/dL — ABNORMAL HIGH (ref 0.57–1.00)
GFR calc Af Amer: 60 mL/min/{1.73_m2} (ref >59)
GFR calc non Af Amer: 52 mL/min/{1.73_m2} — ABNORMAL LOW (ref >59)
Globulin, Total: 2.1 g/dL (ref 1.5–4.5)
Glucose: 93 mg/dL (ref 65–99)
Potassium: 4.3 mmol/L (ref 3.5–5.2)
Sodium: 144 mmol/L (ref 134–144)
Total Protein: 6.3 g/dL (ref 6.0–8.5)

## 2019-07-27 LAB — LIPID PANEL
Chol/HDL Ratio: 2.9 ratio (ref 0.0–4.4)
Cholesterol, Total: 220 mg/dL — ABNORMAL HIGH (ref 100–199)
HDL: 75 mg/dL (ref >39)
LDL Chol Calc (NIH): 133 mg/dL — ABNORMAL HIGH (ref 0–99)
Triglycerides: 66 mg/dL (ref 0–149)
VLDL Cholesterol Cal: 12 mg/dL (ref 5–40)

## 2019-07-28 ENCOUNTER — Other Ambulatory Visit: Payer: Self-pay | Admitting: Family Medicine

## 2019-07-28 DIAGNOSIS — I10 Essential (primary) hypertension: Secondary | ICD-10-CM

## 2019-07-29 ENCOUNTER — Encounter: Payer: Self-pay | Admitting: Gastroenterology

## 2019-08-02 ENCOUNTER — Encounter: Payer: Self-pay | Admitting: Family Medicine

## 2019-08-02 MED ORDER — ATORVASTATIN CALCIUM 20 MG PO TABS: 20.0000 mg | ORAL_TABLET | Freq: Every day | ORAL | 3 refills | Status: DC

## 2019-08-02 NOTE — Addendum Note (Signed)
Addended by: Ladean Raya on: 08/02/2019 04:23 PM   Modules accepted: Orders

## 2019-08-05 DIAGNOSIS — Z23 Encounter for immunization: Secondary | ICD-10-CM | POA: Diagnosis not present

## 2019-08-08 ENCOUNTER — Ambulatory Visit (INDEPENDENT_AMBULATORY_CARE_PROVIDER_SITE_OTHER): Payer: Medicare Other | Admitting: *Deleted

## 2019-08-08 DIAGNOSIS — Z0000 Encounter for general adult medical examination without abnormal findings: Secondary | ICD-10-CM
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
## 2019-08-23 ENCOUNTER — Other Ambulatory Visit: Payer: Self-pay

## 2019-08-23 ENCOUNTER — Ambulatory Visit (AMBULATORY_SURGERY_CENTER): Payer: Self-pay | Admitting: *Deleted

## 2019-08-23 VITALS — Temp 96.9°F | Ht 63.0 in | Wt 129.0 lb

## 2019-08-23 DIAGNOSIS — Z1211 Encounter for screening for malignant neoplasm of colon: Secondary | ICD-10-CM

## 2019-08-23 DIAGNOSIS — Z01818 Encounter for other preprocedural examination: Secondary | ICD-10-CM

## 2019-08-23 MED ORDER — NA SULFATE-K SULFATE-MG SULF 17.5-3.13-1.6 GM/177ML PO SOLN: ORAL | 0 refills | Status: DC

## 2019-08-23 NOTE — Progress Notes (Signed)
Patient is here in-person for PV. Patient denies any allergies to eggs or soy. Patient denies any problems with anesthesia/sedation. Patient denies any oxygen use at home. Patient denies taking any diet/weight loss medications or blood thinners. Patient is not being treated for MRSA or C-diff. EMMI education assisgned to the patient for the procedure, this was explained and instructions given to patient. COVID-19 screening test is on 4/13, the pt is aware.  Only 1st vaccines completed on 08/05/19 per pt.  Patient is aware of our care-partner policy and 0000000 safety protocol.

## 2019-09-02 DIAGNOSIS — Z23 Encounter for immunization: Secondary | ICD-10-CM | POA: Diagnosis not present

## 2019-09-13 ENCOUNTER — Other Ambulatory Visit: Payer: Self-pay

## 2019-09-13 ENCOUNTER — Encounter: Payer: Self-pay | Admitting: Gastroenterology

## 2019-09-13 ENCOUNTER — Ambulatory Visit (AMBULATORY_SURGERY_CENTER): Payer: Medicare Other | Admitting: Gastroenterology

## 2019-09-13 VITALS — BP 132/56 | HR 58 | Temp 95.9°F | Resp 16 | Ht 63.0 in | Wt 129.0 lb

## 2019-09-13 DIAGNOSIS — Z1211 Encounter for screening for malignant neoplasm of colon: Secondary | ICD-10-CM | POA: Diagnosis not present

## 2019-09-13 DIAGNOSIS — I10 Essential (primary) hypertension: Secondary | ICD-10-CM | POA: Diagnosis not present

## 2019-09-13 MED ORDER — SODIUM CHLORIDE 0.9 % IV SOLN: 500.0000 mL | Freq: Once | INTRAVENOUS | Status: DC

## 2019-09-13 NOTE — Op Note (Signed)
[name] Procedure Date: 09/13/2019 9:55 AM MRN: [id number] Endoscopist: Ladene Artist , MD Age: 69 Referring MD:  Date of Birth: 08/06/1950 Gender: Female Account #: [id number] Procedure:                Colonoscopy Indications:              Screening for colorectal malignant neoplasm Medicines:                Monitored Anesthesia Care Procedure:                Pre-Anesthesia Assessment:                           - Prior to the procedure, a History and Physical                            was performed, and patient medications and                            allergies were reviewed. The patient's tolerance of                            previous anesthesia was also reviewed. The risks                            and benefits of the procedure and the sedation                            options and risks were discussed with the patient.                            All questions were answered, and informed consent                            was obtained. Prior Anticoagulants: The patient has                            taken no previous anticoagulant or antiplatelet                            agents. ASA Grade Assessment: II - A patient with                            mild systemic disease. After reviewing the risks                            and benefits, the patient was deemed in                            satisfactory condition to undergo the procedure.                           After obtaining informed consent, the colonoscope  was passed under direct vision. Throughout the                            procedure, the patient's blood pressure, pulse, and                            oxygen saturations were monitored continuously. The                            Colonoscope was introduced through the anus and                            advanced to the the cecum, identified by                            appendiceal orifice and  ileocecal valve. The                            ileocecal valve, appendiceal orifice, and rectum                            were photographed. The quality of the bowel                            preparation was good. The colonoscopy was performed                            without difficulty. The patient tolerated the                            procedure well. Scope In: 10:12:47 AM Scope Out: 10:28:43 AM Scope Withdrawal Time: 0 hours 10 minutes 52 seconds  Total Procedure Duration: 0 hours 15 minutes 56 seconds  Findings:                 The perianal and digital rectal examinations were                            normal.                           Internal hemorrhoids were found during                            retroflexion. The hemorrhoids were small and Grade                            I (internal hemorrhoids that do not prolapse).                           The exam was otherwise without abnormality on                            direct and retroflexion views. Complications:            No immediate complications. Estimated blood loss:  None. Estimated Blood Loss:     Estimated blood loss: none. Impression:               - Internal hemorrhoids.                           - The examination was otherwise normal on direct                            and retroflexion views.                           - No specimens collected. Recommendation:           - Patient has a contact number available for                            emergencies. The signs and symptoms of potential                            delayed complications were discussed with the                            patient. Return to normal activities tomorrow.                            Written discharge instructions were provided to the                            patient.                           - Resume previous diet.                           - Continue present medications.                           - No  repeat colonoscopy due to age and the absence                            of colonic polyps. Ladene Artist, MD 09/13/2019 10:31:47 AM This report has been signed electronically.

## 2019-09-13 NOTE — Patient Instructions (Signed)
YOU HAD AN ENDOSCOPIC PROCEDURE TODAY AT THE [facility]:   Refer to the procedure report that was given to you for any specific questions about what was found during the examination.  If the procedure report does not answer your questions, please call your gastroenterologist to clarify.  If you requested that your care partner not be given the details of your procedure findings, then the procedure report has been included in a sealed envelope for you to review at your convenience later.  **Handout given on hemorrhoids**  YOU SHOULD EXPECT: Some feelings of bloating in the abdomen. Passage of more gas than usual.  Walking can help get rid of the air that was put into your GI tract during the procedure and reduce the bloating. If you had a lower endoscopy (such as a colonoscopy or flexible sigmoidoscopy) you may notice spotting of blood in your stool or on the toilet paper. If you underwent a bowel prep for your procedure, you may not have a normal bowel movement for a few days.  Please Note:  You might notice some irritation and congestion in your nose or some drainage.  This is from the oxygen used during your procedure.  There is no need for concern and it should clear up in a day or so.  SYMPTOMS TO REPORT IMMEDIATELY:  Following lower endoscopy (colonoscopy or flexible sigmoidoscopy):  Excessive amounts of blood in the stool  Significant tenderness or worsening of abdominal pains  Swelling of the abdomen that is new, acute  Fever of 100F or higher   For urgent or emergent issues, a gastroenterologist can be reached at any hour by calling [phone]. Do not use MyChart messaging for urgent concerns.    DIET:  We do recommend a small meal at first, but then you may proceed to your regular diet.  Drink plenty of fluids but you should avoid alcoholic beverages for 24 hours.  ACTIVITY:  You should plan to take it easy for the rest of today and you should NOT DRIVE or use heavy  machinery until tomorrow (because of the sedation medicines used during the test).    FOLLOW UP: Our staff will call the number listed on your records 48-72 hours following your procedure to check on you and address any questions or concerns that you may have regarding the information given to you following your procedure. If we do not reach you, we will leave a message.  We will attempt to reach you two times.  During this call, we will ask if you have developed any symptoms of COVID 19. If you develop any symptoms (ie: fever, flu-like symptoms, shortness of breath, cough etc.) before then, please call [phone].  If you test positive for Covid 19 in the 2 weeks post procedure, please call and report this information to us.    If any biopsies were taken you will be contacted by phone or by letter within the next 1-3 weeks.  Please call us at [phone] if you have not heard about the biopsies in 3 weeks.    SIGNATURES/CONFIDENTIALITY: You and/or your care partner have signed paperwork which will be entered into your electronic medical record.  These signatures attest to the fact that that the information above on your After Visit Summary has been reviewed and is understood.  Full responsibility of the confidentiality of this discharge information lies with you and/or your care-partner.  

## 2019-09-13 NOTE — Progress Notes (Signed)
Pt's states no medical or surgical changes since previsit or office visit.  Temp JB Vitals KA

## 2019-09-13 NOTE — Progress Notes (Signed)
Report to PACU, RN, vss, BBS= Clear.  

## 2019-09-15 IMAGING — DX DG HIP (WITH OR WITHOUT PELVIS) 2-3V*L*
3 series · 3 of 3 positions shown · non-contrast
Comparison: None.

CLINICAL DATA: Pain.  No evidence of injury.

EXAM:
DG HIP (WITH OR WITHOUT PELVIS) 2-3V LEFT

[pelvis ap]
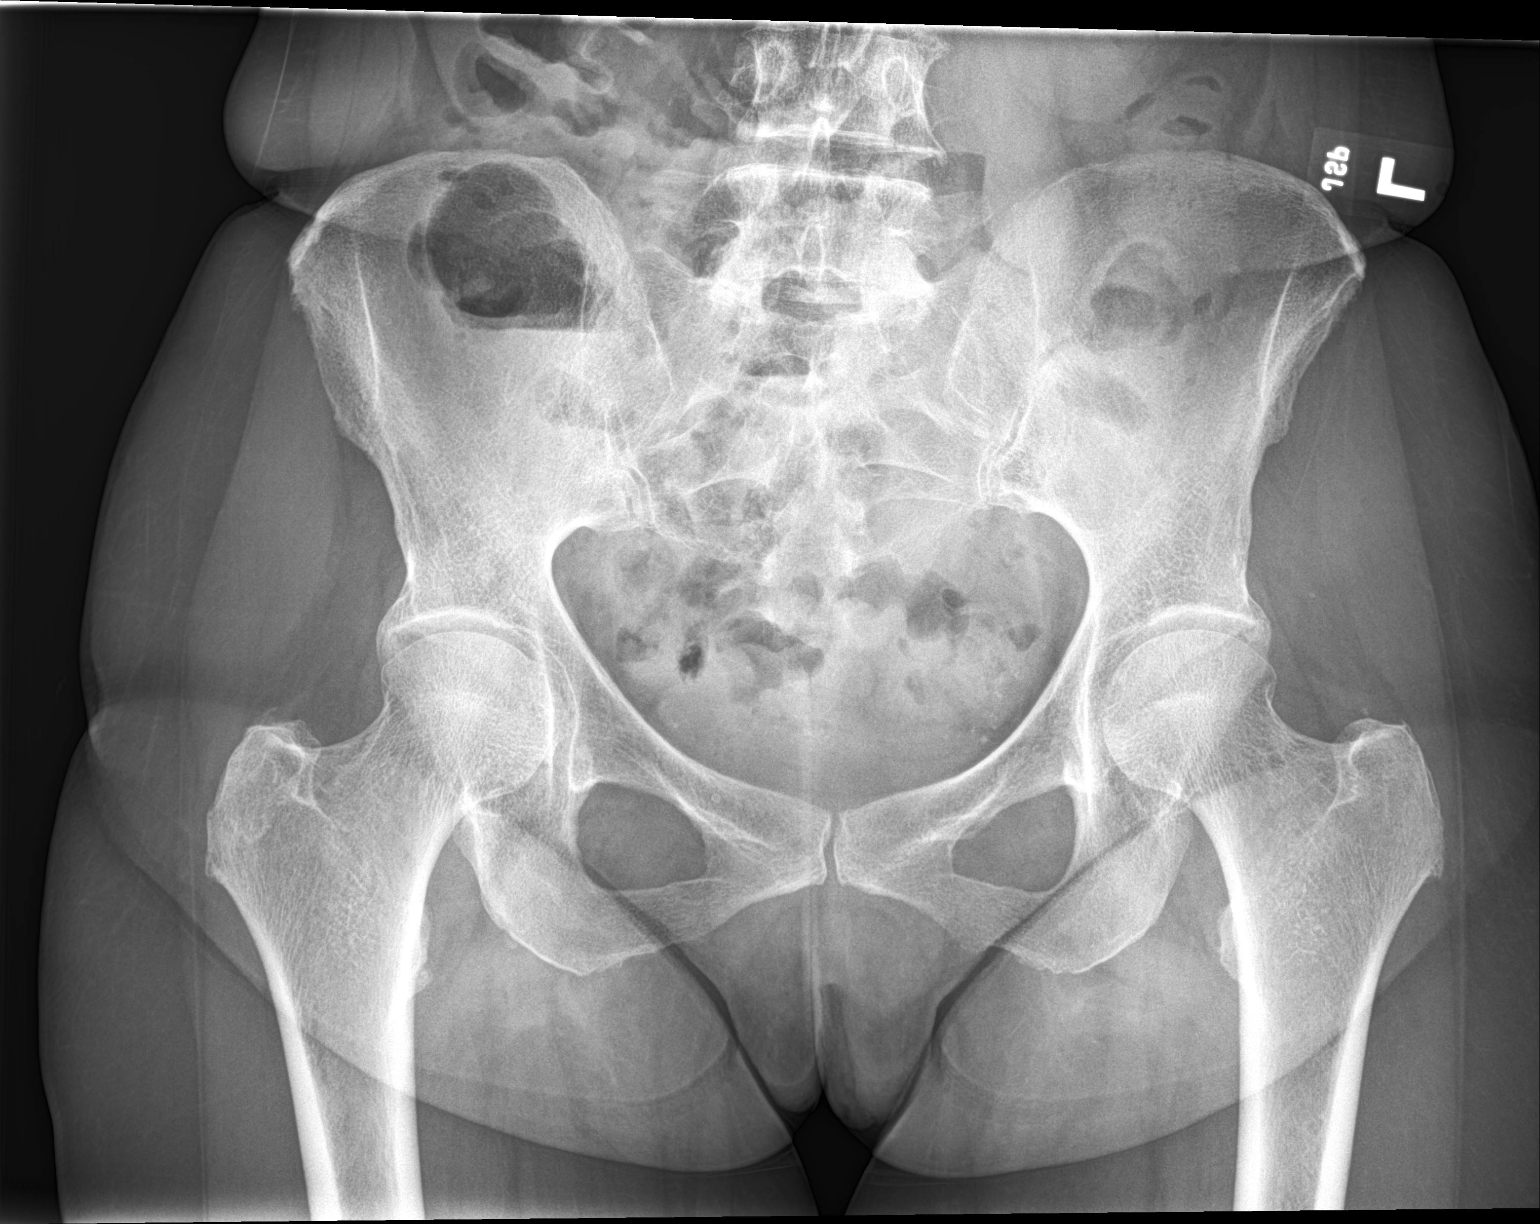

[hip ap]
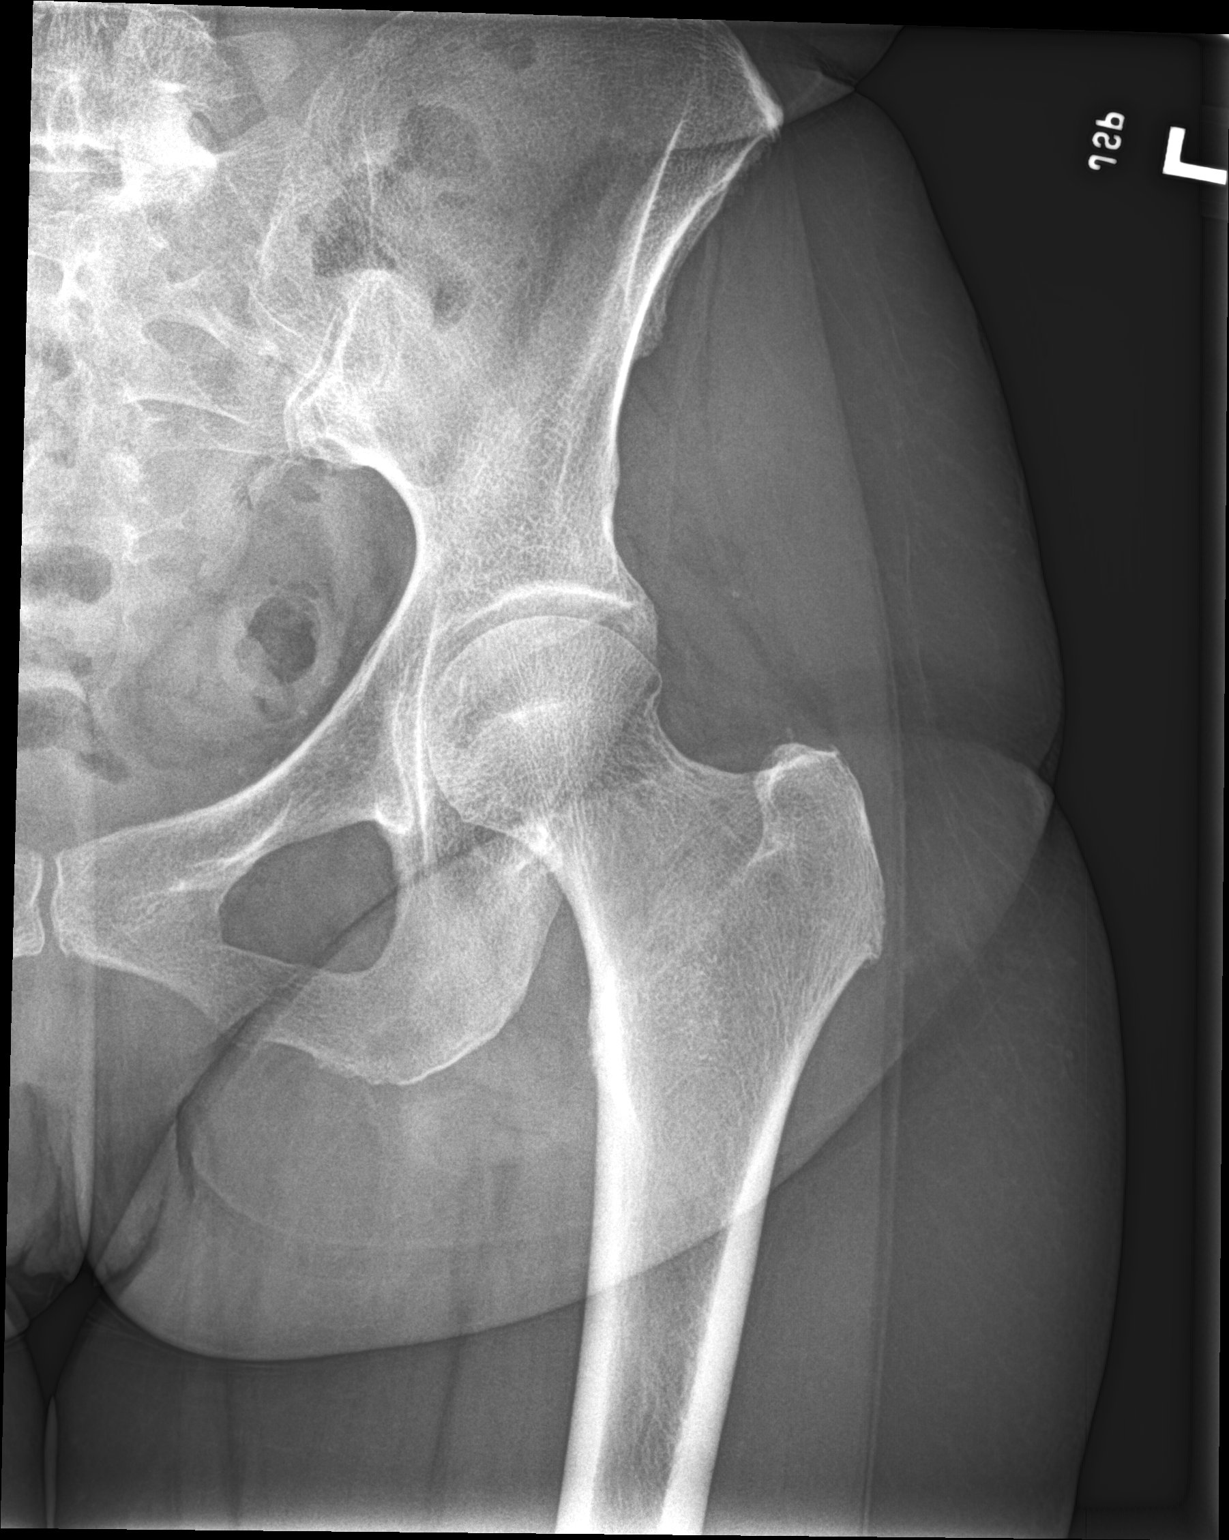

[hip lat]
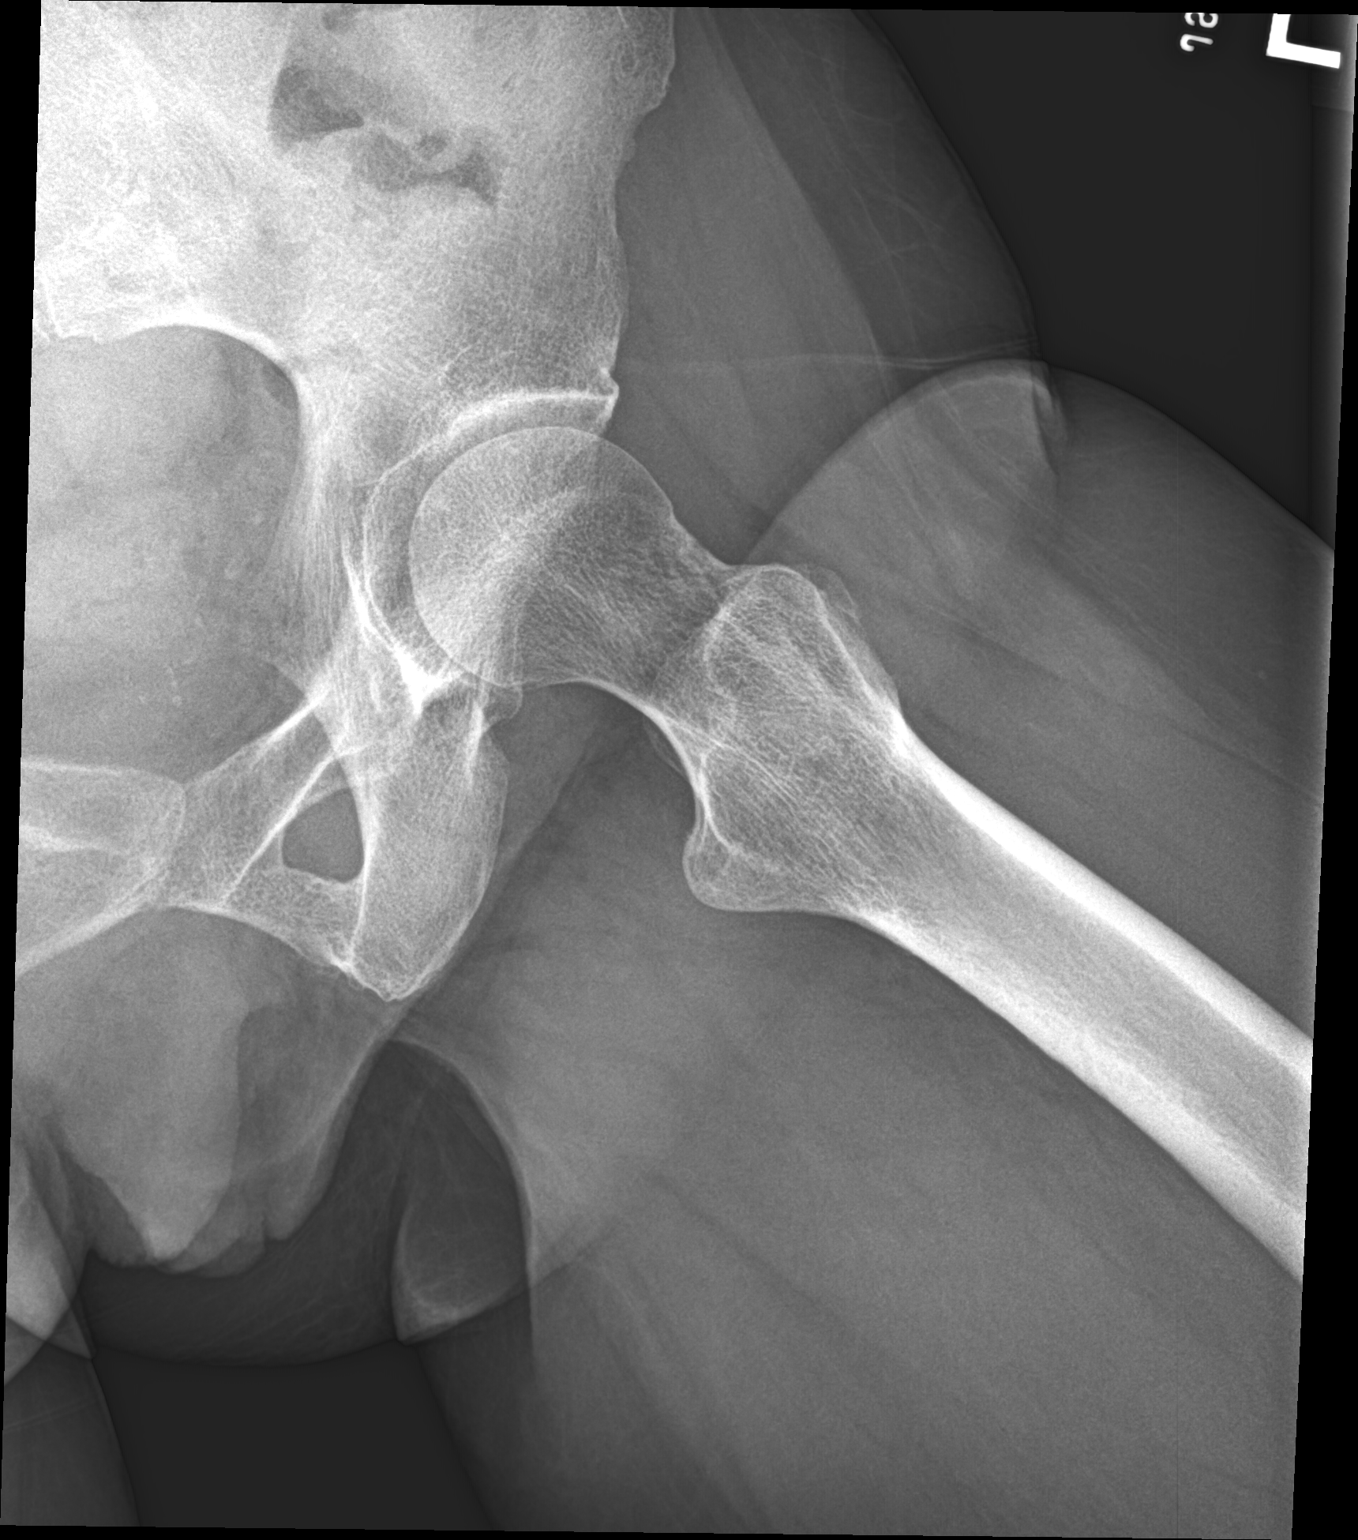

[3 of 3 positions shown; findings below may reference images not displayed]

FINDINGS: No acute fracture. No dislocation. Joint spaces are maintained.
Minimal juxta-articular sclerosis in the superior acetabulums.
IMPRESSION: No acute bony pathology.  Minimal degenerative change.

## 2019-09-17 ENCOUNTER — Telehealth: Payer: Self-pay | Admitting: *Deleted

## 2019-09-17 NOTE — Telephone Encounter (Signed)
  Follow up Call-  Call back number 09/13/2019  Post procedure Call Back phone  # [phone]  Permission to leave phone message Yes  Some recent data might be hidden     Patient questions:  Do you have a fever, pain , or abdominal swelling? No. Pain Score  0 *  Have you tolerated food without any problems? Yes.    Have you been able to return to your normal activities? Yes.    Do you have any questions about your discharge instructions: Diet   No. Medications  No. Follow up visit  No.  Do you have questions or concerns about your Care? No.  Actions: * If pain score is 4 or above: No action needed, pain <4.  1. Have you developed a fever since your procedure? no  2.   Have you had an respiratory symptoms (SOB or cough) since your procedure? no  3.   Have you tested positive for COVID 19 since your procedure no  4.   Have you had any family members/close contacts diagnosed with the COVID 19 since your procedure? no   If yes to any of these questions please route to Joylene John, RN and Erenest Rasher, RN

## 2020-01-03 DIAGNOSIS — Z1231 Encounter for screening mammogram for malignant neoplasm of breast: Secondary | ICD-10-CM | POA: Diagnosis not present

## 2020-03-12 DIAGNOSIS — Z780 Asymptomatic menopausal state: Secondary | ICD-10-CM | POA: Diagnosis not present

## 2020-03-12 DIAGNOSIS — N952 Postmenopausal atrophic vaginitis: Secondary | ICD-10-CM | POA: Diagnosis not present

## 2020-03-12 DIAGNOSIS — Z79890 Hormone replacement therapy (postmenopausal): Secondary | ICD-10-CM | POA: Diagnosis not present

## 2020-03-12 DIAGNOSIS — Z01419 Encounter for gynecological examination (general) (routine) without abnormal findings: Secondary | ICD-10-CM | POA: Diagnosis not present

## 2020-03-12 DIAGNOSIS — Z6822 Body mass index (BMI) 22.0-22.9, adult: Secondary | ICD-10-CM | POA: Diagnosis not present

## 2020-03-23 ENCOUNTER — Encounter: Payer: Self-pay | Admitting: Family Medicine

## 2020-03-23 ENCOUNTER — Ambulatory Visit (INDEPENDENT_AMBULATORY_CARE_PROVIDER_SITE_OTHER): Payer: Medicare Other | Admitting: Family Medicine

## 2020-03-23 ENCOUNTER — Other Ambulatory Visit: Payer: Self-pay

## 2020-03-23 VITALS — BP 140/58 | HR 63 | Temp 97.0°F | Ht 63.0 in | Wt 129.0 lb

## 2020-03-23 DIAGNOSIS — M85859 Other specified disorders of bone density and structure, unspecified thigh: Secondary | ICD-10-CM | POA: Diagnosis not present

## 2020-03-23 DIAGNOSIS — Z23 Encounter for immunization: Secondary | ICD-10-CM | POA: Diagnosis not present

## 2020-03-23 DIAGNOSIS — F419 Anxiety disorder, unspecified: Secondary | ICD-10-CM | POA: Diagnosis not present

## 2020-03-23 DIAGNOSIS — I10 Essential (primary) hypertension: Secondary | ICD-10-CM | POA: Diagnosis not present

## 2020-03-23 DIAGNOSIS — E782 Mixed hyperlipidemia: Secondary | ICD-10-CM | POA: Diagnosis not present

## 2020-03-23 DIAGNOSIS — F32A Depression, unspecified: Secondary | ICD-10-CM

## 2020-03-23 DIAGNOSIS — R7303 Prediabetes: Secondary | ICD-10-CM | POA: Diagnosis not present

## 2020-03-23 LAB — BAYER DCA HB A1C WAIVED: HB A1C (BAYER DCA - WAIVED): 5.9 % (ref <7.0)

## 2020-03-23 MED ORDER — ATORVASTATIN CALCIUM 20 MG PO TABS: 20.0000 mg | ORAL_TABLET | Freq: Every day | ORAL | 3 refills | Status: DC

## 2020-03-23 MED ORDER — ESCITALOPRAM OXALATE 10 MG PO TABS: 10.0000 mg | ORAL_TABLET | Freq: Every day | ORAL | 3 refills | Status: DC

## 2020-03-23 MED ORDER — LISINOPRIL 30 MG PO TABS: 30.0000 mg | ORAL_TABLET | Freq: Every day | ORAL | 3 refills | Status: DC

## 2020-03-23 MED ORDER — CALCIUM CARBONATE-VITAMIN D 500-200 MG-UNIT PO TABS: 1.0000 | ORAL_TABLET | Freq: Every day | ORAL | 3 refills | Status: DC

## 2020-03-23 MED ORDER — HYDROCHLOROTHIAZIDE 25 MG PO TABS: 25.0000 mg | ORAL_TABLET | Freq: Every day | ORAL | 3 refills | Status: DC

## 2020-03-23 NOTE — Progress Notes (Signed)
BP (!) 140/58   Pulse 63   Temp (!) 97 F (36.1 C)   Ht 5' 3"  (1.6 m)   Wt 129 lb (58.5 kg)   LMP  (LMP Unknown)   SpO2 100%   BMI 22.85 kg/m    Subjective:   Patient ID: Pam Franklin, female    DOB: Nov 07, 1950, 69 y.o.   MRN: [id number]  HPI: Pam Franklin is a 69 y.o. female presenting on 03/23/2020 for Hypertension   HPI Hypertension Patient is currently on lisinopril hydrochlorothiazide, and their blood pressure today is 140/58. Patient denies any lightheadedness or dizziness. Patient denies headaches, blurred vision, chest pains, shortness of breath, or weakness. Denies any side effects from medication and is content with current medication.   Prediabetes Patient comes in today for recheck of his diabetes. Patient has been currently taking no medication. Patient is currently on an ACE inhibitor/ARB. Patient has seen an ophthalmologist this year. Patient denies any issues with their feet. The symptom started onset as an adult hypertension and hyperlipidemia ARE RELATED TO DM   Hyperlipidemia Patient is coming in for recheck of his hyperlipidemia. The patient is currently taking atorvastatin and cinnamon. They deny any issues with myalgias or history of liver damage from it. They deny any focal numbness or weakness or chest pain.   Anxiety depression recheck Is coming in for recheck of her anxiety and depression.  She is still taking the Lexapro and feels like is doing very well for her.  She is very happy with where it started and wants to continue forward with it.  She typically takes a 5 mg in the morning and 5 mg in the afternoon because she does not like to take the whole 10 mg at 1 time.  Relevant past medical, surgical, family and social history reviewed and updated as indicated. Interim medical history since our last visit reviewed. Allergies and medications reviewed and updated.  Review of Systems  Constitutional: Negative for chills and fever.  Eyes: Negative for  visual disturbance.  Respiratory: Negative for chest tightness and shortness of breath.   Cardiovascular: Negative for chest pain and leg swelling.  Musculoskeletal: Negative for back pain and gait problem.  Skin: Negative for rash.  Neurological: Negative for light-headedness and headaches.  Psychiatric/Behavioral: Negative for agitation and behavioral problems.  All other systems reviewed and are negative.   Per HPI unless specifically indicated above   Allergies as of 03/23/2020      Reactions   Nitrofurantoin Nausea And Vomiting   REACTION: nausea and vomiting   Prednisone Nausea Only      Medication List       Accurate as of March 23, 2020  4:24 PM. If you have any questions, ask your nurse or doctor.        aspirin 81 MG tablet Take 81 mg by mouth daily.   atorvastatin 20 MG tablet Commonly known as: LIPITOR Take 1 tablet (20 mg total) by mouth daily.   azelaic acid 20 % cream Commonly known as: AZELEX Apply topically 2 (two) times daily. After skin is thoroughly washed and patted dry, gently but thoroughly massage a thin film of azelaic acid cream into the affected area twice daily, in the morning and evening.   Biotin 5000 5 MG Caps Generic drug: Biotin Take 1 capsule by mouth daily.   calcium-vitamin D 500-200 MG-UNIT tablet Commonly known as: OSCAL WITH D Take 1 tablet by mouth daily with breakfast.   Cinnamon 500  MG capsule Take 500 mg by mouth daily.   cycloSPORINE 0.05 % ophthalmic emulsion Commonly known as: RESTASIS 1 drop 2 (two) times daily.   escitalopram 10 MG tablet Commonly known as: LEXAPRO Take 1 tablet (10 mg total) by mouth daily.   estradiol 0.0375 MG/24HR Commonly known as: VIVELLE-DOT Place 1 patch onto the skin 2 (two) times a week.   hydrochlorothiazide 25 MG tablet Commonly known as: HYDRODIURIL Take 1 tablet (25 mg total) by mouth daily. What changed: See the new instructions. Changed by: Fransisca Kaufmann Danyelle Brookover, MD    L-Lysine 1000 MG Tabs Take 1 tablet by mouth daily.   lisinopril 30 MG tablet Commonly known as: ZESTRIL Take 1 tablet (30 mg total) by mouth daily.   Magnesium 250 MG Tabs Take 1 tablet by mouth daily.   ZyrTEC Allergy 10 MG Caps Generic drug: Cetirizine HCl Take 10 mg by mouth daily.        Objective:   BP (!) 140/58   Pulse 63   Temp (!) 97 F (36.1 C)   Ht 5' 3"  (1.6 m)   Wt 129 lb (58.5 kg)   LMP  (LMP Unknown)   SpO2 100%   BMI 22.85 kg/m   Wt Readings from Last 3 Encounters:  03/23/20 129 lb (58.5 kg)  09/13/19 129 lb (58.5 kg)  08/23/19 129 lb (58.5 kg)    Physical Exam Vitals and nursing note reviewed.  Constitutional:      General: She is not in acute distress.    Appearance: She is well-developed. She is not diaphoretic.  Eyes:     Conjunctiva/sclera: Conjunctivae normal.  Cardiovascular:     Rate and Rhythm: Normal rate and regular rhythm.     Heart sounds: Normal heart sounds. No murmur heard.   Pulmonary:     Effort: Pulmonary effort is normal. No respiratory distress.     Breath sounds: Normal breath sounds. No wheezing.  Abdominal:     General: Abdomen is flat. Bowel sounds are normal. There is no distension.     Tenderness: There is no abdominal tenderness. There is no right CVA tenderness, left CVA tenderness, guarding or rebound.  Musculoskeletal:        General: No tenderness. Normal range of motion.  Skin:    General: Skin is warm and dry.     Findings: No rash.  Neurological:     Mental Status: She is alert and oriented to person, place, and time.     Coordination: Coordination normal.  Psychiatric:        Behavior: Behavior normal.       Assessment & Plan:   Problem List Items Addressed This Visit      Cardiovascular and Mediastinum   Essential hypertension, benign   Relevant Medications   hydrochlorothiazide (HYDRODIURIL) 25 MG tablet   lisinopril (ZESTRIL) 30 MG tablet   atorvastatin (LIPITOR) 20 MG tablet   Other  Relevant Orders   CBC with Differential/Platelet   CMP14+EGFR     Musculoskeletal and Integument   Osteopenia   Relevant Medications   calcium-vitamin D (OSCAL WITH D) 500-200 MG-UNIT tablet     Other   HLD (hyperlipidemia)   Relevant Medications   hydrochlorothiazide (HYDRODIURIL) 25 MG tablet   lisinopril (ZESTRIL) 30 MG tablet   atorvastatin (LIPITOR) 20 MG tablet   Other Relevant Orders   Lipid panel   Prediabetes - Primary   Relevant Orders   Bayer DCA Hb A1c Waived   Anxiety and depression  Relevant Medications   escitalopram (LEXAPRO) 10 MG tablet   Other Relevant Orders   CBC with Differential/Platelet    Other Visit Diagnoses    Need for immunization against influenza       Relevant Orders   Flu Vaccine QUAD High Dose(Fluad) (Completed)      Patient's blood pressure seems to be doing well, the rest of your medication is doing well per her.  No change in medication, will check blood work and A1c and follow-up in 6 months Follow up plan: Return in about 6 months (around 09/21/2020), or if symptoms worsen or fail to improve, for Anxiety prediabetes.  Counseling provided for all of the vaccine components Orders Placed This Encounter  Procedures  . Flu Vaccine QUAD High Dose(Fluad)  . Bayer DCA Hb A1c Waived  . CBC with Differential/Platelet  . CMP14+EGFR  . Lipid panel    Caryl Pina, MD [facility] 03/23/2020, 4:24 PM

## 2020-03-24 LAB — CMP14+EGFR
ALT: 16 IU/L (ref 0–32)
AST: 17 IU/L (ref 0–40)
Albumin/Globulin Ratio: 1.9 (ref 1.2–2.2)
Albumin: 4.2 g/dL (ref 3.7–4.7)
Alkaline Phosphatase: 106 IU/L (ref 44–121)
BUN/Creatinine Ratio: 14 (ref 12–28)
BUN: 18 mg/dL (ref 8–27)
Bilirubin Total: 0.4 mg/dL (ref 0.0–1.2)
CO2: 26 mmol/L (ref 20–29)
Calcium: 9.4 mg/dL (ref 8.7–10.3)
Chloride: 101 mmol/L (ref 96–106)
Creatinine, Ser: 1.26 mg/dL — ABNORMAL HIGH (ref 0.57–1.00)
GFR calc Af Amer: 50 mL/min/{1.73_m2} — ABNORMAL LOW (ref >59)
GFR calc non Af Amer: 43 mL/min/{1.73_m2} — ABNORMAL LOW (ref >59)
Globulin, Total: 2.2 g/dL (ref 1.5–4.5)
Glucose: 92 mg/dL (ref 65–99)
Potassium: 4.1 mmol/L (ref 3.5–5.2)
Sodium: 141 mmol/L (ref 134–144)
Total Protein: 6.4 g/dL (ref 6.0–8.5)

## 2020-03-24 LAB — CBC WITH DIFFERENTIAL/PLATELET
Basophils Absolute: 0.1 10*3/uL (ref 0.0–0.2)
Basos: 1 %
EOS (ABSOLUTE): 0.2 10*3/uL (ref 0.0–0.4)
Eos: 3 %
Hematocrit: 33.7 % — ABNORMAL LOW (ref 34.0–46.6)
Hemoglobin: 11.3 g/dL (ref 11.1–15.9)
Immature Grans (Abs): 0 10*3/uL (ref 0.0–0.1)
Immature Granulocytes: 0 %
Lymphocytes Absolute: 2.5 10*3/uL (ref 0.7–3.1)
Lymphs: 41 %
MCH: 31.5 pg (ref 26.6–33.0)
MCHC: 33.5 g/dL (ref 31.5–35.7)
MCV: 94 fL (ref 79–97)
Monocytes Absolute: 0.5 10*3/uL (ref 0.1–0.9)
Monocytes: 9 %
Neutrophils Absolute: 2.8 10*3/uL (ref 1.4–7.0)
Neutrophils: 46 %
Platelets: 233 10*3/uL (ref 150–450)
RBC: 3.59 x10E6/uL — ABNORMAL LOW (ref 3.77–5.28)
RDW: 11.8 % (ref 11.7–15.4)
WBC: 6.1 10*3/uL (ref 3.4–10.8)

## 2020-03-24 LAB — LIPID PANEL
Chol/HDL Ratio: 2.3 ratio (ref 0.0–4.4)
Cholesterol, Total: 142 mg/dL (ref 100–199)
HDL: 62 mg/dL (ref >39)
LDL Chol Calc (NIH): 66 mg/dL (ref 0–99)
Triglycerides: 72 mg/dL (ref 0–149)
VLDL Cholesterol Cal: 14 mg/dL (ref 5–40)

## 2020-04-07 DIAGNOSIS — Z23 Encounter for immunization: Secondary | ICD-10-CM | POA: Diagnosis not present

## 2020-06-22 DIAGNOSIS — L570 Actinic keratosis: Secondary | ICD-10-CM | POA: Diagnosis not present

## 2020-06-22 DIAGNOSIS — L814 Other melanin hyperpigmentation: Secondary | ICD-10-CM | POA: Diagnosis not present

## 2020-06-22 DIAGNOSIS — L821 Other seborrheic keratosis: Secondary | ICD-10-CM | POA: Diagnosis not present

## 2020-07-01 ENCOUNTER — Encounter: Payer: Self-pay | Admitting: Family Medicine

## 2020-07-01 ENCOUNTER — Ambulatory Visit (INDEPENDENT_AMBULATORY_CARE_PROVIDER_SITE_OTHER): Payer: Medicare Other | Admitting: Family Medicine

## 2020-07-01 VITALS — BP 136/63 | HR 67 | Temp 98.0°F

## 2020-07-01 DIAGNOSIS — R059 Cough, unspecified: Secondary | ICD-10-CM | POA: Diagnosis not present

## 2020-07-01 DIAGNOSIS — R0981 Nasal congestion: Secondary | ICD-10-CM | POA: Diagnosis not present

## 2020-07-01 DIAGNOSIS — R519 Headache, unspecified: Secondary | ICD-10-CM | POA: Diagnosis not present

## 2020-07-01 LAB — VERITOR FLU A/B WAIVED
Influenza A: NEGATIVE
Influenza B: NEGATIVE

## 2020-07-01 MED ORDER — GUAIFENESIN ER 600 MG PO TB12: 600.0000 mg | ORAL_TABLET | Freq: Two times a day (BID) | ORAL | 1 refills | Status: DC

## 2020-07-01 MED ORDER — BENZONATATE 100 MG PO CAPS: 100.0000 mg | ORAL_CAPSULE | Freq: Three times a day (TID) | ORAL | 0 refills | Status: DC | PRN
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
## 2020-07-02 LAB — SARS-COV-2, NAA 2 DAY TAT

## 2020-07-02 LAB — NOVEL CORONAVIRUS, NAA: SARS-CoV-2, NAA: DETECTED — AB

## 2020-07-03 ENCOUNTER — Other Ambulatory Visit: Payer: Self-pay | Admitting: Family Medicine

## 2020-07-03 ENCOUNTER — Telehealth (HOSPITAL_COMMUNITY): Payer: Self-pay | Admitting: Family

## 2020-07-03 ENCOUNTER — Ambulatory Visit (HOSPITAL_COMMUNITY)
Admission: RE | Admit: 2020-07-03 | Discharge: 2020-07-03 | Disposition: A | Discharge status: Home / Self Care | Payer: Medicare Other | Source: Ambulatory Visit | Attending: Pulmonary Disease | Admitting: Pulmonary Disease

## 2020-07-03 ENCOUNTER — Telehealth (HOSPITAL_COMMUNITY): Payer: Self-pay

## 2020-07-03 ENCOUNTER — Other Ambulatory Visit: Payer: Self-pay | Admitting: Infectious Diseases

## 2020-07-03 ENCOUNTER — Other Ambulatory Visit (HOSPITAL_COMMUNITY): Payer: Self-pay | Admitting: Family

## 2020-07-03 DIAGNOSIS — U071 COVID-19: Secondary | ICD-10-CM

## 2020-07-03 DIAGNOSIS — Z5181 Encounter for therapeutic drug level monitoring: Secondary | ICD-10-CM | POA: Diagnosis not present

## 2020-07-03 LAB — BASIC METABOLIC PANEL
Anion gap: 11 (ref 5–15)
BUN: 19 mg/dL (ref 8–23)
CO2: 27 mmol/L (ref 22–32)
Calcium: 9.6 mg/dL (ref 8.9–10.3)
Chloride: 99 mmol/L (ref 98–111)
Creatinine, Ser: 1.16 mg/dL — ABNORMAL HIGH (ref 0.44–1.00)
GFR, Estimated: 50 mL/min — ABNORMAL LOW (ref >60)
Glucose, Bld: 104 mg/dL — ABNORMAL HIGH (ref 70–99)
Potassium: 4.1 mmol/L (ref 3.5–5.1)
Sodium: 137 mmol/L (ref 135–145)

## 2020-07-03 MED ORDER — NIRMATRELVIR/RITONAVIR (PAXLOVID)TABLET: 2.0000 | ORAL_TABLET | Freq: Two times a day (BID) | ORAL | 0 refills | Status: AC

## 2020-07-03 MED FILL — PAXLOVID 20 X 150 MG & 10 X: 20 X 150 MG | 5 days supply | Qty: 20 | Fill #0

## 2020-07-03 NOTE — Telephone Encounter (Signed)
Patient was prescribed oral covid treatment Paxlovid and treatment note was reviewed. Medication has been received by [name] and reviewed for appropriateness.  Drug Interactions or Dosage Adjustments Noted: Hold Atorvastatin, reduced dosage based on GFR  Delivery Method: pick up  Patient contacted for counseling on 07/03/20 and verbalized understanding.   Delivery or Pick-Up Date: 07/03/20   Pam Franklin 07/03/2020, 3:11 PM [name] Phone# [phone]

## 2020-07-03 NOTE — Telephone Encounter (Signed)
SCr 1.12 with eGFR 50. Will proceed with reduced dose paxlovid    Janene Madeira, MSN, NP-C [facility] [email or web address] Pager: [phone] Office: [phone] [place]: [phone]
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
## 2020-07-03 NOTE — Progress Notes (Signed)
Labs ordered to determine candidacy for paxlovid therapy as she is covid positive. Symptom onset was 06/30/2020. Please see telephone note for further.

## 2020-07-03 NOTE — Addendum Note (Signed)
Addended by: DeWitt Callas on: 07/03/2020 02:50 PM   Modules accepted: Orders

## 2020-08-11 ENCOUNTER — Telehealth: Payer: Self-pay | Admitting: *Deleted

## 2020-08-11 ENCOUNTER — Ambulatory Visit (INDEPENDENT_AMBULATORY_CARE_PROVIDER_SITE_OTHER): Payer: Medicare Other | Admitting: *Deleted

## 2020-08-11 VITALS — BP 130/75 | Ht 63.0 in | Wt 129.0 lb

## 2020-08-11 DIAGNOSIS — Z0000 Encounter for general adult medical examination without abnormal findings: Secondary | ICD-10-CM
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
## 2020-08-11 NOTE — Patient Instructions (Signed)
  Pam Franklin , Thank you for taking time to come for your Medicare Wellness Visit. I appreciate your ongoing commitment to your health goals. Please review the following plan we discussed and let me know if I can assist you in the future.   These are the goals we discussed: Goals    . DIET - INCREASE WATER INTAKE     Increase water to 48 to 64 oz per day    . Exercise 3x per week (30 min per time)     Walking is a great option.     . Patient Stated     08/08/2019 AWV Goal: Fall Prevention  . Over the next year, patient will decrease their risk for falls by: o Using assistive devices, such as a cane or walker, as needed o Identifying fall risks within their home and correcting them by: - Removing throw rugs - Adding handrails to stairs or ramps - Removing clutter and keeping a clear pathway throughout the home - Increasing light, especially at night - Adding shower handles/bars - Raising toilet seat o Identifying potential personal risk factors for falls: - Medication side effects - Incontinence/urgency - Vestibular dysfunction - Hearing loss - Musculoskeletal disorders - Neurological disorders - Orthostatic hypotension         This is a list of the screening recommended for you and due dates:  Health Maintenance  Topic Date Due  . DEXA scan (bone density measurement)  08/05/2020  . Mammogram  01/02/2022  . Tetanus Vaccine  03/01/2023  . Colon Cancer Screening  09/12/2029  . Flu Shot  Completed  . COVID-19 Vaccine  Completed  .  Hepatitis C: One time screening is recommended by Center for Disease Control  (CDC) for  adults born from [birth year] through 1967.   Completed  . Pneumonia vaccines  Completed  . HPV Vaccine  Aged Out

## 2020-08-11 NOTE — Telephone Encounter (Signed)
Pt was called for AWV - during visit she asked if she can have some labs done at her April OV with Dettinger to test for Ovarian Cancer?  She states that she can't think of the name of the test, but that it starts with a "C".  She has family HX of ovarian cancer and has had some LLQ pain in past and some US done in past - all have been ok, this test would ease her mind.  Aware I will ask and see if it can be done.

## 2020-08-12 NOTE — Telephone Encounter (Signed)
Pt informed. She may call her insurance to see if they cover the lab. Otherwise, will discuss further at April appt.

## 2020-08-12 NOTE — Telephone Encounter (Signed)
Please let her know that we can do labs at her April office visit but there is not necessarily a lab test for ovarian cancer to diagnose it, there is a Ca1 25 that they use as a test to follow people who already have ovarian cancer but it is not used to diagnose ovarian cancer because it is not reliable.  We can discuss that lab specifically more during her visit and if she really would like to do it we could but it is not highly reliable for diagnosis and her insurance may not pay for it so she may have to pay out-of-pocket for it but I would be okay to run it if she would be okay pain for it.  We will discuss that further at her visit in April.

## 2020-09-21 ENCOUNTER — Encounter: Payer: Self-pay | Admitting: Family Medicine

## 2020-09-21 ENCOUNTER — Ambulatory Visit (INDEPENDENT_AMBULATORY_CARE_PROVIDER_SITE_OTHER): Payer: Medicare Other | Admitting: Family Medicine

## 2020-09-21 ENCOUNTER — Other Ambulatory Visit: Payer: Self-pay

## 2020-09-21 VITALS — BP 138/65 | HR 70 | Ht 63.0 in | Wt 121.0 lb

## 2020-09-21 DIAGNOSIS — F32A Depression, unspecified: Secondary | ICD-10-CM | POA: Diagnosis not present

## 2020-09-21 DIAGNOSIS — E782 Mixed hyperlipidemia: Secondary | ICD-10-CM | POA: Diagnosis not present

## 2020-09-21 DIAGNOSIS — F419 Anxiety disorder, unspecified: Secondary | ICD-10-CM

## 2020-09-21 DIAGNOSIS — R7303 Prediabetes: Secondary | ICD-10-CM

## 2020-09-21 DIAGNOSIS — I10 Essential (primary) hypertension: Secondary | ICD-10-CM

## 2020-09-21 LAB — BAYER DCA HB A1C WAIVED: HB A1C (BAYER DCA - WAIVED): 6.1 % (ref <7.0)

## 2020-09-21 NOTE — Progress Notes (Signed)
 BP 138/65   Pulse 70   Ht 5' 3" (1.6 m)   Wt 121 lb (54.9 kg)   LMP  (LMP Unknown)   SpO2 99%   BMI 21.43 kg/m    Subjective:   Patient ID: Pam Franklin, female    DOB: 02/07/1951, 70 y.o.   MRN: [id number]  HPI: Pam Franklin is a 70 y.o. female presenting on 09/21/2020 for Medical Management of Chronic Issues and Hypertension   HPI Prediabetes Patient comes in today for recheck of his diabetes. Patient has been currently taking no medication. Patient is currently on an ACE inhibitor/ARB. Patient has not seen an ophthalmologist this year. Patient denies any issues with their feet. The symptom started onset as an adult hypertension and hyperlipidemia ARE RELATED TO DM   Hyperlipidemia Patient is coming in for recheck of his hyperlipidemia. The patient is currently taking atorvastatin. They deny any issues with myalgias or history of liver damage from it. They deny any focal numbness or weakness or chest pain.   Hypertension Patient is currently on lisinopril and hydrochlorothiazide, and their blood pressure today is 138/65. Patient denies any lightheadedness or dizziness. Patient denies headaches, blurred vision, chest pains, shortness of breath, or weakness. Denies any side effects from medication and is content with current medication.   Patient says she is very anxious stiffness in her hips and she did see an orthopedic numbness that was more on her back.  She says she does not been bothering her but is not following to want to do anything about it at this point just wanted annotated.  Patient does have anxiety and depression and says that her anxiety is not completely under control but she is stable with where she is, she is currently taking Lexapro 5 in the morning and 5 in the evening but does not want to change anything right now.  She says she has a lot of family issues going on and that is the stem of a lot of her worries and anxiety.  Relevant past medical, surgical, family  and social history reviewed and updated as indicated. Interim medical history since our last visit reviewed. Allergies and medications reviewed and updated.  Review of Systems  Constitutional: Negative for chills and fever.  Eyes: Negative for redness and visual disturbance.  Respiratory: Negative for chest tightness and shortness of breath.   Cardiovascular: Negative for chest pain and leg swelling.  Musculoskeletal: Positive for arthralgias. Negative for back pain and gait problem.  Skin: Negative for rash.  Neurological: Negative for light-headedness and headaches.  Psychiatric/Behavioral: Negative for agitation and behavioral problems.  All other systems reviewed and are negative.   Per HPI unless specifically indicated above   Allergies as of 09/21/2020      Reactions   Nitrofurantoin Nausea And Vomiting   REACTION: nausea and vomiting   Prednisone Nausea Only      Medication List       Accurate as of September 21, 2020 11:28 AM. If you have any questions, ask your nurse or doctor.        aspirin 81 MG tablet Take 81 mg by mouth daily.   atorvastatin 20 MG tablet Commonly known as: LIPITOR Take 1 tablet (20 mg total) by mouth daily.   azelaic acid 20 % cream Commonly known as: AZELEX Apply topically 2 (two) times daily. After skin is thoroughly washed and patted dry, gently but thoroughly massage a thin film of azelaic acid cream into the affected area   twice daily, in the morning and evening.   benzonatate 100 MG capsule Commonly known as: Tessalon Perles Take 1 capsule (100 mg total) by mouth 3 (three) times daily as needed for cough.   Biotin 5 MG Caps Take 1 capsule by mouth daily.   calcium-vitamin D 500-200 MG-UNIT tablet Commonly known as: OSCAL WITH D Take 1 tablet by mouth daily with breakfast.   Cetirizine HCl 10 MG Caps Take 10 mg by mouth daily.   Cinnamon 500 MG capsule Take 500 mg by mouth daily.   cycloSPORINE 0.05 % ophthalmic  emulsion Commonly known as: RESTASIS 1 drop 2 (two) times daily.   escitalopram 10 MG tablet Commonly known as: LEXAPRO Take 1 tablet (10 mg total) by mouth daily.   estradiol 0.0375 MG/24HR Commonly known as: VIVELLE-DOT Place 1 patch onto the skin 2 (two) times a week.   guaiFENesin 600 MG 12 hr tablet Commonly known as: Mucinex Take 1 tablet (600 mg total) by mouth 2 (two) times daily.   hydrochlorothiazide 25 MG tablet Commonly known as: HYDRODIURIL Take 1 tablet (25 mg total) by mouth daily.   L-Lysine 1000 MG Tabs Take 1 tablet by mouth daily.   lisinopril 30 MG tablet Commonly known as: ZESTRIL Take 1 tablet (30 mg total) by mouth daily.   Magnesium 250 MG Tabs Take 1 tablet by mouth daily.   Paxlovid 20 x 150 MG & 10 x 100MG Tbpk Generic drug: Nirmatrelvir & Ritonavir TAKE 2 TABLETS BY MOUTH 2 (TWO) TIMES DAILY FOR 5 DAYS.        Objective:   BP 138/65   Pulse 70   Ht 5' 3" (1.6 m)   Wt 121 lb (54.9 kg)   LMP  (LMP Unknown)   SpO2 99%   BMI 21.43 kg/m   Wt Readings from Last 3 Encounters:  09/21/20 121 lb (54.9 kg)  08/11/20 129 lb (58.5 kg)  03/23/20 129 lb (58.5 kg)    Physical Exam Vitals and nursing note reviewed.  Constitutional:      General: She is not in acute distress.    Appearance: She is well-developed. She is not diaphoretic.  Eyes:     Conjunctiva/sclera: Conjunctivae normal.  Cardiovascular:     Rate and Rhythm: Normal rate and regular rhythm.     Heart sounds: Normal heart sounds. No murmur heard.   Pulmonary:     Effort: Pulmonary effort is normal. No respiratory distress.     Breath sounds: Normal breath sounds. No wheezing.  Musculoskeletal:        General: No tenderness. Normal range of motion.  Skin:    General: Skin is warm and dry.     Findings: No rash.  Neurological:     Mental Status: She is alert and oriented to person, place, and time.     Coordination: Coordination normal.  Psychiatric:        Behavior:  Behavior normal.       Assessment & Plan:   Problem List Items Addressed This Visit      Cardiovascular and Mediastinum   Essential hypertension, benign     Other   HLD (hyperlipidemia)   Relevant Orders   CBC with Differential/Platelet   CMP14+EGFR   Lipid panel   Prediabetes - Primary   Relevant Orders   CBC with Differential/Platelet   CMP14+EGFR   Lipid panel   Bayer DCA Hb A1c Waived   Anxiety and depression   Relevant Orders   Thyroid Panel With  TSH      Patient is that she is stable, no adjustments in medication currently, will check blood work and follow-up in 6 months  Already has an orthopedic for her arthralgias. Follow up plan: Return in about 6 months (around 03/23/2021), or if symptoms worsen or fail to improve, for Hypertension and hyperlipidemia.  Counseling provided for all of the vaccine components No orders of the defined types were placed in this encounter.   Joshua Dettinger, MD [facility] 09/21/2020, 11:28 AM     

## 2020-09-22 LAB — CBC WITH DIFFERENTIAL/PLATELET
Basophils Absolute: 0.1 10*3/uL (ref 0.0–0.2)
Basos: 1 %
EOS (ABSOLUTE): 0.2 10*3/uL (ref 0.0–0.4)
Eos: 3 %
Hematocrit: 37.5 % (ref 34.0–46.6)
Hemoglobin: 12.1 g/dL (ref 11.1–15.9)
Immature Grans (Abs): 0 10*3/uL (ref 0.0–0.1)
Immature Granulocytes: 0 %
Lymphocytes Absolute: 2.1 10*3/uL (ref 0.7–3.1)
Lymphs: 34 %
MCH: 31 pg (ref 26.6–33.0)
MCHC: 32.3 g/dL (ref 31.5–35.7)
MCV: 96 fL (ref 79–97)
Monocytes Absolute: 0.6 10*3/uL (ref 0.1–0.9)
Monocytes: 9 %
Neutrophils Absolute: 3.2 10*3/uL (ref 1.4–7.0)
Neutrophils: 53 %
Platelets: 250 10*3/uL (ref 150–450)
RBC: 3.9 x10E6/uL (ref 3.77–5.28)
RDW: 11.8 % (ref 11.7–15.4)
WBC: 6.1 10*3/uL (ref 3.4–10.8)

## 2020-09-22 LAB — CMP14+EGFR
ALT: 11 IU/L (ref 0–32)
AST: 14 IU/L (ref 0–40)
Albumin/Globulin Ratio: 1.4 (ref 1.2–2.2)
Albumin: 4.2 g/dL (ref 3.7–4.7)
Alkaline Phosphatase: 104 IU/L (ref 44–121)
BUN/Creatinine Ratio: 16 (ref 12–28)
BUN: 19 mg/dL (ref 8–27)
Bilirubin Total: 0.6 mg/dL (ref 0.0–1.2)
CO2: 25 mmol/L (ref 20–29)
Calcium: 10.3 mg/dL (ref 8.7–10.3)
Chloride: 103 mmol/L (ref 96–106)
Creatinine, Ser: 1.22 mg/dL — ABNORMAL HIGH (ref 0.57–1.00)
Globulin, Total: 3 g/dL (ref 1.5–4.5)
Glucose: 98 mg/dL (ref 65–99)
Potassium: 4.6 mmol/L (ref 3.5–5.2)
Sodium: 142 mmol/L (ref 134–144)
Total Protein: 7.2 g/dL (ref 6.0–8.5)
eGFR: 47 mL/min/{1.73_m2} — ABNORMAL LOW (ref >59)

## 2020-09-22 LAB — THYROID PANEL WITH TSH
Free Thyroxine Index: 2.1 (ref 1.2–4.9)
T3 Uptake Ratio: 26 % (ref 24–39)
T4, Total: 8.2 ug/dL (ref 4.5–12.0)
TSH: 1.38 u[IU]/mL (ref 0.450–4.500)

## 2020-09-22 LAB — LIPID PANEL
Chol/HDL Ratio: 2.1 ratio (ref 0.0–4.4)
Cholesterol, Total: 150 mg/dL (ref 100–199)
HDL: 73 mg/dL (ref >39)
LDL Chol Calc (NIH): 65 mg/dL (ref 0–99)
Triglycerides: 61 mg/dL (ref 0–149)
VLDL Cholesterol Cal: 12 mg/dL (ref 5–40)

## 2020-12-08 NOTE — Telephone Encounter (Signed)
error 

## 2020-12-14 ENCOUNTER — Encounter: Payer: Self-pay | Admitting: Cardiology

## 2020-12-14 ENCOUNTER — Ambulatory Visit (INDEPENDENT_AMBULATORY_CARE_PROVIDER_SITE_OTHER): Payer: Medicare Other | Admitting: Cardiology

## 2020-12-14 ENCOUNTER — Other Ambulatory Visit: Payer: Self-pay

## 2020-12-14 VITALS — BP 120/60 | HR 58 | Ht 63.0 in | Wt 125.0 lb

## 2020-12-14 DIAGNOSIS — E7800 Pure hypercholesterolemia, unspecified: Secondary | ICD-10-CM

## 2020-12-14 DIAGNOSIS — I340 Nonrheumatic mitral (valve) insufficiency: Secondary | ICD-10-CM

## 2020-12-14 DIAGNOSIS — I10 Essential (primary) hypertension: Secondary | ICD-10-CM | POA: Diagnosis not present

## 2020-12-14 NOTE — Patient Instructions (Signed)
Medication Instructions:  The current medical regimen is effective;  continue present plan and medications.  *If you need a refill on your cardiac medications before your next appointment, please call your pharmacy*  Follow-Up: At [facility], you and your health needs are our priority.  As part of our continuing mission to provide you with exceptional heart care, we have created designated Provider Care Teams.  These Care Teams include your primary Cardiologist (physician) and Advanced Practice Providers (APPs -  Physician Assistants and Nurse Practitioners) who all work together to provide you with the care you need, when you need it.  We recommend signing up for the patient portal called "MyChart".  Sign up information is provided on this After Visit Summary.  MyChart is used to connect with patients for Virtual Visits (Telemedicine).  Patients are able to view lab/test results, encounter notes, upcoming appointments, etc.  Non-urgent messages can be sent to your provider as well.   To learn more about what you can do with MyChart, go to [email or web address].    Your next appointment:   2 year(s)  The format for your next appointment:   In Person  Provider:   Candee Furbish, MD   Iliotibial Band Syndrome  Iliotibial band syndrome is a condition that often causes knee pain. It can also cause pain in the outside of the hip, thigh, and knee. The iliotibial band is a strip of tissue in each of the legs. This band runs from the outside ofthe hip and down the thigh to the outside of the knee. Repeatedly bending and straightening your knee can irritate your iliotibialband. What are the causes? This condition is caused by inflammation from rubbing (friction) of the iliotibial band as it moves over the thigh bone (femur) when you bend and straighten your knee again and again. What increases the risk? You are more likely to develop this condition if: You change elevation often while running  on a treadmill. You run very long distances. You recently increased the length or intensity of your workouts. Intensity means how much effort you put in. You run downhill often, or you just started running downhill. You ride a bike very far or often. You may also be at greater risk if: You start a new workout routine without first warming up your muscles. You have a job that requires you to bend, squat, or climb often. What are the signs or symptoms? Symptoms of this condition include: Pain along the outside of your knee that may be worse with activity, especially running or going up and down stairs. A feeling like a snap over your knee or hip. Swelling on the outside of your knee. Pain or a feeling of tightness in your hip. How is this diagnosed? This condition is diagnosed based on: Your symptoms. Your medical history. A physical exam. You may also see a health care provider who specializes in reducing pain and improving movement (physical therapist). A physical therapist may do an exam to check your balance, movement, and way of walking or running (gait) to see whether the way you move could add to your injury. You may also havetests to measure your strength, flexibility, and range of motion. How is this treated? This condition may be treated by: Taking NSAIDs, such as ibuprofen, to help relieve pain and swelling. Resting and limiting exercise. Returning to activities gradually. Doing exercises to improve movement and strength (physical therapy) as told by your health care provider. Having an injection of steroid medicine. This is  medicine that helps relieve inflammation. Having surgery. This may be done if your symptoms do not improve after other treatments. Follow these instructions at home: Managing pain, stiffness, and swelling If directed, put ice on the injured area. To do this: Put ice in a plastic bag. Place a towel between your skin and the bag. Leave the ice on for 20  minutes, 2-3 times a day. Remove the ice if your skin turns bright red. This is very important. If you cannot feel pain, heat, or cold, you have a greater risk of damage to the area.  Activity Return to your normal activities as told by your health care provider. Ask your health care provider what activities are safe for you. Include low-impact activities, such as swimming, in your exercise routine. Check with your health care provider to make sure running is safe for you. Do exercises as told by your health care provider. General instructions Take over-the-counter and prescription medicines only as told by your health care provider. Make sure you wear shoes that fit well and have good cushioning and arch support. Keep all follow-up visits. This is important. How is this prevented? Warm up and stretch before being active. Cool down and stretch after being active. Give your body time to rest between periods of activity. Consider getting help from a coach or trainer to come up with a safe running plan and a plan to advance (progress) your training that fits your goals and ability. Change directions often while running around a track. Replace your shoes when the soles are worn out. Maintain physical fitness. This includes strength and flexibility. Contact a health care provider if: Your pain does not improve. Your pain gets worse even with treatment. Summary Iliotibial band syndrome is a condition that often causes knee pain. It can also cause pain in the outside of your hip, thigh, and knee. Treatment includes taking NSAIDs, resting, returning to activities gradually, and doing physical therapy exercises. Return to your normal activities as told by your health care provider. Ask your health care provider what activities are safe for you. This information is not intended to replace advice given to you by your health care provider. Make sure you discuss any questions you have with your  healthcare provider. Document Revised: 09/16/2019 Document Reviewed: 09/16/2019 Elsevier Patient Education  [place].
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
## 2020-12-22 DIAGNOSIS — M19032 Primary osteoarthritis, left wrist: Secondary | ICD-10-CM | POA: Diagnosis not present

## 2020-12-22 DIAGNOSIS — M25532 Pain in left wrist: Secondary | ICD-10-CM | POA: Diagnosis not present

## 2020-12-22 DIAGNOSIS — M1812 Unilateral primary osteoarthritis of first carpometacarpal joint, left hand: Secondary | ICD-10-CM | POA: Diagnosis not present

## 2021-01-08 DIAGNOSIS — Z1231 Encounter for screening mammogram for malignant neoplasm of breast: Secondary | ICD-10-CM | POA: Diagnosis not present

## 2021-01-21 ENCOUNTER — Ambulatory Visit (INDEPENDENT_AMBULATORY_CARE_PROVIDER_SITE_OTHER): Payer: Medicare Other | Admitting: *Deleted

## 2021-01-21 ENCOUNTER — Other Ambulatory Visit: Payer: Self-pay

## 2021-01-21 DIAGNOSIS — Z111 Encounter for screening for respiratory tuberculosis: Secondary | ICD-10-CM | POA: Diagnosis not present

## 2021-01-25 ENCOUNTER — Ambulatory Visit (INDEPENDENT_AMBULATORY_CARE_PROVIDER_SITE_OTHER): Payer: Medicare Other | Admitting: *Deleted

## 2021-01-25 ENCOUNTER — Other Ambulatory Visit: Payer: Self-pay

## 2021-01-25 DIAGNOSIS — Z111 Encounter for screening for respiratory tuberculosis: Secondary | ICD-10-CM

## 2021-01-25 LAB — TB SKIN TEST
Induration: 0 mm
TB Skin Test: NEGATIVE

## 2021-02-03 ENCOUNTER — Encounter: Payer: Self-pay | Admitting: Family Medicine

## 2021-02-03 ENCOUNTER — Ambulatory Visit (INDEPENDENT_AMBULATORY_CARE_PROVIDER_SITE_OTHER): Payer: Medicare Other | Admitting: Family Medicine

## 2021-02-03 ENCOUNTER — Other Ambulatory Visit: Payer: Self-pay

## 2021-02-03 VITALS — BP 119/66 | HR 64 | Ht 63.0 in | Wt 125.0 lb

## 2021-02-03 DIAGNOSIS — F32A Depression, unspecified: Secondary | ICD-10-CM

## 2021-02-03 DIAGNOSIS — I10 Essential (primary) hypertension: Secondary | ICD-10-CM | POA: Diagnosis not present

## 2021-02-03 DIAGNOSIS — R7303 Prediabetes: Secondary | ICD-10-CM | POA: Diagnosis not present

## 2021-02-03 DIAGNOSIS — F419 Anxiety disorder, unspecified: Secondary | ICD-10-CM | POA: Diagnosis not present

## 2021-02-03 DIAGNOSIS — M85859 Other specified disorders of bone density and structure, unspecified thigh: Secondary | ICD-10-CM

## 2021-02-03 DIAGNOSIS — Z0001 Encounter for general adult medical examination with abnormal findings: Secondary | ICD-10-CM | POA: Diagnosis not present

## 2021-02-03 DIAGNOSIS — Z0000 Encounter for general adult medical examination without abnormal findings: Secondary | ICD-10-CM | POA: Diagnosis not present

## 2021-02-03 DIAGNOSIS — E7800 Pure hypercholesterolemia, unspecified: Secondary | ICD-10-CM | POA: Diagnosis not present

## 2021-02-03 LAB — BAYER DCA HB A1C WAIVED: HB A1C (BAYER DCA - WAIVED): 6 % — ABNORMAL HIGH (ref 4.8–5.6)

## 2021-02-03 MED ORDER — LISINOPRIL 30 MG PO TABS: 30.0000 mg | ORAL_TABLET | Freq: Every day | ORAL | 3 refills | Status: DC

## 2021-02-03 MED ORDER — ESCITALOPRAM OXALATE 10 MG PO TABS: 10.0000 mg | ORAL_TABLET | Freq: Every day | ORAL | 3 refills | Status: DC

## 2021-02-03 MED ORDER — ATORVASTATIN CALCIUM 20 MG PO TABS: 20.0000 mg | ORAL_TABLET | Freq: Every day | ORAL | 3 refills | Status: DC

## 2021-02-03 MED ORDER — HYDROCHLOROTHIAZIDE 25 MG PO TABS: 25.0000 mg | ORAL_TABLET | Freq: Every day | ORAL | 3 refills | Status: DC

## 2021-02-03 MED ORDER — CALCIUM CARBONATE-VITAMIN D 500-200 MG-UNIT PO TABS: 1.0000 | ORAL_TABLET | Freq: Every day | ORAL | 3 refills | Status: DC

## 2021-02-03 NOTE — Addendum Note (Signed)
Addended by: Caryl Pina on: 02/03/2021 03:24 PM   Modules accepted: Orders
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
## 2021-02-04 LAB — CBC WITH DIFFERENTIAL/PLATELET
Basophils Absolute: 0 10*3/uL (ref 0.0–0.2)
Basos: 1 %
EOS (ABSOLUTE): 0.1 10*3/uL (ref 0.0–0.4)
Eos: 2 %
Hematocrit: 33 % — ABNORMAL LOW (ref 34.0–46.6)
Hemoglobin: 11.3 g/dL (ref 11.1–15.9)
Immature Grans (Abs): 0 10*3/uL (ref 0.0–0.1)
Immature Granulocytes: 0 %
Lymphocytes Absolute: 2.3 10*3/uL (ref 0.7–3.1)
Lymphs: 39 %
MCH: 30.8 pg (ref 26.6–33.0)
MCHC: 34.2 g/dL (ref 31.5–35.7)
MCV: 90 fL (ref 79–97)
Monocytes Absolute: 0.5 10*3/uL (ref 0.1–0.9)
Monocytes: 8 %
Neutrophils Absolute: 2.9 10*3/uL (ref 1.4–7.0)
Neutrophils: 50 %
Platelets: 305 10*3/uL (ref 150–450)
RBC: 3.67 x10E6/uL — ABNORMAL LOW (ref 3.77–5.28)
RDW: 11.6 % — ABNORMAL LOW (ref 11.7–15.4)
WBC: 5.7 10*3/uL (ref 3.4–10.8)

## 2021-02-04 LAB — CMP14+EGFR
ALT: 10 IU/L (ref 0–32)
AST: 20 IU/L (ref 0–40)
Albumin/Globulin Ratio: 2 (ref 1.2–2.2)
Albumin: 4.3 g/dL (ref 3.7–4.7)
Alkaline Phosphatase: 121 IU/L (ref 44–121)
BUN/Creatinine Ratio: 17 (ref 12–28)
BUN: 21 mg/dL (ref 8–27)
Bilirubin Total: 0.6 mg/dL (ref 0.0–1.2)
CO2: 24 mmol/L (ref 20–29)
Calcium: 9.7 mg/dL (ref 8.7–10.3)
Chloride: 100 mmol/L (ref 96–106)
Creatinine, Ser: 1.22 mg/dL — ABNORMAL HIGH (ref 0.57–1.00)
Globulin, Total: 2.2 g/dL (ref 1.5–4.5)
Glucose: 92 mg/dL (ref 65–99)
Potassium: 4.3 mmol/L (ref 3.5–5.2)
Sodium: 138 mmol/L (ref 134–144)
Total Protein: 6.5 g/dL (ref 6.0–8.5)
eGFR: 47 mL/min/{1.73_m2} — ABNORMAL LOW (ref >59)

## 2021-02-04 LAB — LIPID PANEL
Chol/HDL Ratio: 2.3 ratio (ref 0.0–4.4)
Cholesterol, Total: 142 mg/dL (ref 100–199)
HDL: 63 mg/dL (ref >39)
LDL Chol Calc (NIH): 66 mg/dL (ref 0–99)
Triglycerides: 65 mg/dL (ref 0–149)
VLDL Cholesterol Cal: 13 mg/dL (ref 5–40)

## 2021-02-04 LAB — MEASLES/MUMPS/RUBELLA IMMUNITY
MUMPS ABS, IGG: 191 AU/mL (ref >10.9)
RUBEOLA AB, IGG: 269 AU/mL (ref >16.4)
Rubella Antibodies, IGG: 22.3 index (ref >0.99)

## 2021-02-04 LAB — HEPATITIS B SURFACE ANTIBODY, QUANTITATIVE: Hepatitis B Surf Ab Quant: <3.1 m[IU]/mL — ABNORMAL LOW (ref >9.9)

## 2021-02-04 LAB — TSH: TSH: 1.51 u[IU]/mL (ref 0.450–4.500)

## 2021-02-09 ENCOUNTER — Other Ambulatory Visit: Payer: Self-pay

## 2021-02-09 ENCOUNTER — Ambulatory Visit (INDEPENDENT_AMBULATORY_CARE_PROVIDER_SITE_OTHER): Payer: Medicare Other

## 2021-02-09 DIAGNOSIS — Z23 Encounter for immunization: Secondary | ICD-10-CM | POA: Diagnosis not present

## 2021-02-24 ENCOUNTER — Ambulatory Visit: Payer: Medicare Other | Admitting: Family Medicine

## 2021-03-09 ENCOUNTER — Other Ambulatory Visit: Payer: Self-pay

## 2021-03-09 ENCOUNTER — Ambulatory Visit (INDEPENDENT_AMBULATORY_CARE_PROVIDER_SITE_OTHER): Payer: Medicare Other

## 2021-03-09 DIAGNOSIS — Z23 Encounter for immunization: Secondary | ICD-10-CM

## 2021-03-15 ENCOUNTER — Ambulatory Visit (INDEPENDENT_AMBULATORY_CARE_PROVIDER_SITE_OTHER): Payer: Medicare Other | Admitting: Family Medicine

## 2021-03-15 ENCOUNTER — Other Ambulatory Visit: Payer: Medicare Other

## 2021-03-15 ENCOUNTER — Encounter: Payer: Self-pay | Admitting: Family Medicine

## 2021-03-15 DIAGNOSIS — J029 Acute pharyngitis, unspecified: Secondary | ICD-10-CM

## 2021-03-15 DIAGNOSIS — J069 Acute upper respiratory infection, unspecified: Secondary | ICD-10-CM

## 2021-03-15 LAB — VERITOR FLU A/B WAIVED
Influenza A: NEGATIVE
Influenza B: NEGATIVE
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
## 2021-03-15 NOTE — Addendum Note (Signed)
Addended by: Reather Converse on: 03/15/2021 10:02 AM   Modules accepted: Orders

## 2021-03-16 LAB — SARS-COV-2, NAA 2 DAY TAT

## 2021-03-16 LAB — NOVEL CORONAVIRUS, NAA: SARS-CoV-2, NAA: NOT DETECTED

## 2021-03-17 ENCOUNTER — Ambulatory Visit (INDEPENDENT_AMBULATORY_CARE_PROVIDER_SITE_OTHER): Payer: Medicare Other | Admitting: Family Medicine

## 2021-03-17 ENCOUNTER — Encounter: Payer: Self-pay | Admitting: Family Medicine

## 2021-03-17 DIAGNOSIS — J0110 Acute frontal sinusitis, unspecified: Secondary | ICD-10-CM

## 2021-03-17 MED ORDER — AMOXICILLIN 500 MG PO CAPS: 500.0000 mg | ORAL_CAPSULE | Freq: Two times a day (BID) | ORAL | 0 refills | Status: DC

## 2021-03-17 MED ORDER — FLUTICASONE PROPIONATE 50 MCG/ACT NA SUSP: 1.0000 | Freq: Two times a day (BID) | NASAL | 6 refills | Status: AC | PRN

## 2021-03-17 NOTE — Progress Notes (Signed)
Virtual Visit via telephone Note  I connected with Pam Franklin on 03/17/21 at 0907 by telephone and verified that I am speaking with the correct person using two identifiers. Pam Franklin is currently located at home and patient are currently with her during visit. The provider, Fransisca Kaufmann Chi Woodham, MD is located in their office at time of visit.  Call ended at 0914  I discussed the limitations, risks, security and privacy concerns of performing an evaluation and management service by telephone and the availability of in person appointments. I also discussed with the patient that there may be a patient responsible charge related to this service. The patient expressed understanding and agreed to proceed.   History and Present Illness: Patient is having coughing until she is sore in chest and abdomen.  She coughs until she loses breath.  She has a lot of congestion and cough keeps her up at night. She has pressure in sinus and head.  She is using mucinex and robitussin and saline and alka seltzer cold and they are not helping much. She had 1 fever 5 days ago when it started but not since. She has exertional SOB and gets SOB when coughing. She denies sick contacts except granddaughter had strep 1.5 weeks ago.   1. Acute non-recurrent frontal sinusitis     Outpatient Encounter Medications as of 03/17/2021  Medication Sig   amoxicillin (AMOXIL) 500 MG capsule Take 1 capsule (500 mg total) by mouth 2 (two) times daily.   fluticasone (FLONASE) 50 MCG/ACT nasal spray Place 1 spray into both nostrils 2 (two) times daily as needed for allergies or rhinitis.   aspirin 81 MG tablet Take 81 mg by mouth daily.   atorvastatin (LIPITOR) 20 MG tablet Take 1 tablet (20 mg total) by mouth daily.   azelaic acid (AZELEX) 20 % cream Apply topically 2 (two) times daily. After skin is thoroughly washed and patted dry, gently but thoroughly massage a thin film of azelaic acid cream into the affected area twice  daily, in the morning and evening.   Biotin 5 MG CAPS Take 1 capsule by mouth daily.   calcium-vitamin D (OSCAL WITH D) 500-200 MG-UNIT tablet Take 1 tablet by mouth daily with breakfast.   Cetirizine HCl 10 MG CAPS Take 10 mg by mouth daily.    Cinnamon 500 MG capsule Take 500 mg by mouth daily.   cycloSPORINE (RESTASIS) 0.05 % ophthalmic emulsion 1 drop 2 (two) times daily.   escitalopram (LEXAPRO) 10 MG tablet Take 1 tablet (10 mg total) by mouth daily.   estradiol (VIVELLE-DOT) 0.0375 MG/24HR Place 1 patch onto the skin 2 (two) times a week.   hydrochlorothiazide (HYDRODIURIL) 25 MG tablet Take 1 tablet (25 mg total) by mouth daily.   L-Lysine 1000 MG TABS Take 1 tablet by mouth daily.   lisinopril (ZESTRIL) 30 MG tablet Take 1 tablet (30 mg total) by mouth daily.   Magnesium 250 MG TABS Take 1 tablet by mouth daily.   No facility-administered encounter medications on file as of 03/17/2021.    Review of Systems  Constitutional:  Positive for fever. Negative for chills.  HENT:  Positive for congestion, postnasal drip, rhinorrhea, sinus pressure, sneezing and sore throat. Negative for ear discharge and ear pain.   Eyes:  Negative for pain, redness and visual disturbance.  Respiratory:  Positive for cough. Negative for chest tightness, shortness of breath and wheezing.   Cardiovascular:  Negative for chest pain and leg swelling.  Genitourinary:  Negative for difficulty urinating and dysuria.  Musculoskeletal:  Negative for back pain and gait problem.  Skin:  Negative for rash.  Neurological:  Negative for light-headedness and headaches.  Psychiatric/Behavioral:  Negative for agitation and behavioral problems.   All other systems reviewed and are negative.  Observations/Objective: Patient sounds comfortable and in no acute distress  Assessment and Plan: Problem List Items Addressed This Visit   None Visit Diagnoses     Acute non-recurrent frontal sinusitis    -  Primary    Relevant Medications   amoxicillin (AMOXIL) 500 MG capsule   fluticasone (FLONASE) 50 MCG/ACT nasal spray      Will treat like sinuses infection, sent in amoxicillin and Flonase for her and continue to use Mucinex and Robitussin.  Call if not we will improving or worsening, may need an appointment for chest x-ray if worsens or gets more into her lungs.  Follow up plan: Return if symptoms worsen or fail to improve.     I discussed the assessment and treatment plan with the patient. The patient was provided an opportunity to ask questions and all were answered. The patient agreed with the plan and demonstrated an understanding of the instructions.   The patient was advised to call back or seek an in-person evaluation if the symptoms worsen or if the condition fails to improve as anticipated.  The above assessment and management plan was discussed with the patient. The patient verbalized understanding of and has agreed to the management plan. Patient is aware to call the clinic if symptoms persist or worsen. Patient is aware when to return to the clinic for a follow-up visit. Patient educated on when it is appropriate to go to the emergency department.    I provided 7 minutes of non-face-to-face time during this encounter.    Worthy Rancher, MD

## 2021-03-18 ENCOUNTER — Telehealth: Payer: Self-pay | Admitting: Family Medicine

## 2021-03-22 NOTE — Telephone Encounter (Signed)
Left message for pt to return call on cell. Home number was busy.

## 2021-03-24 ENCOUNTER — Ambulatory Visit: Payer: Medicare Other | Admitting: Family Medicine

## 2021-05-18 DIAGNOSIS — R300 Dysuria: Secondary | ICD-10-CM | POA: Diagnosis not present

## 2021-05-18 DIAGNOSIS — N952 Postmenopausal atrophic vaginitis: Secondary | ICD-10-CM | POA: Diagnosis not present

## 2021-05-18 DIAGNOSIS — Z79890 Hormone replacement therapy (postmenopausal): Secondary | ICD-10-CM | POA: Diagnosis not present

## 2021-05-18 DIAGNOSIS — Z01419 Encounter for gynecological examination (general) (routine) without abnormal findings: Secondary | ICD-10-CM | POA: Diagnosis not present

## 2021-06-22 DIAGNOSIS — D225 Melanocytic nevi of trunk: Secondary | ICD-10-CM | POA: Diagnosis not present

## 2021-06-22 DIAGNOSIS — L814 Other melanin hyperpigmentation: Secondary | ICD-10-CM | POA: Diagnosis not present

## 2021-06-22 DIAGNOSIS — L718 Other rosacea: Secondary | ICD-10-CM | POA: Diagnosis not present

## 2021-06-22 DIAGNOSIS — L821 Other seborrheic keratosis: Secondary | ICD-10-CM | POA: Diagnosis not present

## 2021-06-22 DIAGNOSIS — L820 Inflamed seborrheic keratosis: Secondary | ICD-10-CM | POA: Diagnosis not present

## 2021-07-14 DIAGNOSIS — Z20822 Contact with and (suspected) exposure to covid-19: Secondary | ICD-10-CM | POA: Diagnosis not present

## 2021-08-04 ENCOUNTER — Encounter: Payer: Self-pay | Admitting: Family Medicine

## 2021-08-04 ENCOUNTER — Ambulatory Visit (INDEPENDENT_AMBULATORY_CARE_PROVIDER_SITE_OTHER): Payer: Medicare Other | Admitting: Family Medicine

## 2021-08-04 VITALS — BP 112/75 | HR 72 | Ht 63.0 in | Wt 120.0 lb

## 2021-08-04 DIAGNOSIS — Z23 Encounter for immunization: Secondary | ICD-10-CM | POA: Diagnosis not present

## 2021-08-04 DIAGNOSIS — E7800 Pure hypercholesterolemia, unspecified: Secondary | ICD-10-CM | POA: Diagnosis not present

## 2021-08-04 DIAGNOSIS — I10 Essential (primary) hypertension: Secondary | ICD-10-CM | POA: Diagnosis not present

## 2021-08-04 DIAGNOSIS — R7303 Prediabetes: Secondary | ICD-10-CM | POA: Diagnosis not present

## 2021-08-04 LAB — BAYER DCA HB A1C WAIVED: HB A1C (BAYER DCA - WAIVED): 5.7 % — ABNORMAL HIGH (ref 4.8–5.6)

## 2021-08-04 NOTE — Progress Notes (Signed)
? ?BP 112/75   Pulse 72   Ht _0  (1.6 m)   Wt 120 lb (54.4 kg)   LMP  (LMP Unknown)   SpO2 95%   BMI 21.26 kg/m?   ? ?Subjective:  ? ?Patient ID: Pam Franklin, female    DOB: 1951-02-01, 71 y.o.   MRN: [id number] ? ?HPI: ?Pam Franklin is a 71 y.o. female presenting on 08/04/2021 for Medical Management of Chronic Issues, Hypertension, Prediabetes, nasal drainage, Cough, and need for 3rd Hep B injection ? ? ?HPI ?Hypertension ?Patient is currently on hydrochlorothiazide and lisinopril, and their blood pressure today is 112/70. Patient denies any lightheadedness or dizziness. Patient denies headaches, blurred vision, chest pains, shortness of breath, or weakness. Denies any side effects from medication and is content with current medication.  ? ?Prediabetes  ?patient comes in today for recheck of his diabetes. Patient has been currently taking no medication currently, has been diet controlled, A1c is 5.7. Patient is not currently on an ACE inhibitor/ARB. Patient has not seen an ophthalmologist this year. Patient denies any issues with their feet. The symptom started onset as an adult htn ARE RELATED TO DM  ? ?Relevant past medical, surgical, family and social history reviewed and updated as indicated. Interim medical history since our last visit reviewed. ?Allergies and medications reviewed and updated. ? ?Review of Systems  ?Constitutional:  Negative for chills and fever.  ?HENT:  Positive for congestion.   ?Eyes:  Negative for visual disturbance.  ?Respiratory:  Negative for chest tightness and shortness of breath.   ?Cardiovascular:  Negative for chest pain and leg swelling.  ?Musculoskeletal:  Negative for back pain and gait problem.  ?Skin:  Negative for rash.  ?Neurological:  Negative for dizziness, light-headedness and headaches.  ?Psychiatric/Behavioral:  Negative for agitation and behavioral problems.   ?All other systems reviewed and are negative. ? ?Per HPI unless specifically indicated  above ? ? ?Allergies as of 08/04/2021   ? ?   Reactions  ? Nitrofurantoin Nausea And Vomiting  ? REACTION: nausea and vomiting  ? Prednisone Nausea Only  ? ?  ? ?  ?Medication List  ?  ? ?  ? Accurate as of August 04, 2021  3:19 PM. If you have any questions, ask your nurse or doctor.  ?  ?  ? ?  ? ?STOP taking these medications   ? ?amoxicillin 500 MG capsule ?Commonly known as: AMOXIL ?Stopped by: Worthy Rancher, MD ?  ? ?  ? ?TAKE these medications   ? ?aspirin 81 MG tablet ?Take 81 mg by mouth daily. ?  ?atorvastatin 20 MG tablet ?Commonly known as: LIPITOR ?Take 1 tablet (20 mg total) by mouth daily. ?  ?azelaic acid 20 % cream ?Commonly known as: AZELEX ?Apply topically 2 (two) times daily. After skin is thoroughly washed and patted dry, gently but thoroughly massage a thin film of azelaic acid cream into the affected area twice daily, in the morning and evening. ?  ?Biotin 5 MG Caps ?Take 1 capsule by mouth daily. ?  ?calcium-vitamin D 500-200 MG-UNIT tablet ?Commonly known as: OSCAL WITH D ?Take 1 tablet by mouth daily with breakfast. ?  ?Cetirizine HCl 10 MG Caps ?Take 10 mg by mouth daily. ?  ?Cinnamon 500 MG capsule ?Take 500 mg by mouth daily. ?  ?cycloSPORINE 0.05 % ophthalmic emulsion ?Commonly known as: RESTASIS ?1 drop 2 (two) times daily. ?  ?escitalopram 10 MG tablet ?Commonly known as: LEXAPRO ?Take 1 tablet (  10 mg total) by mouth daily. ?  ?estradiol 0.0375 MG/24HR ?Commonly known as: VIVELLE-DOT ?Place 1 patch onto the skin 2 (two) times a week. ?  ?fluticasone 50 MCG/ACT nasal spray ?Commonly known as: FLONASE ?Place 1 spray into both nostrils 2 (two) times daily as needed for allergies or rhinitis. ?  ?hydrochlorothiazide 25 MG tablet ?Commonly known as: HYDRODIURIL ?Take 1 tablet (25 mg total) by mouth daily. ?  ?L-Lysine 1000 MG Tabs ?Take 1 tablet by mouth daily. ?  ?lisinopril 30 MG tablet ?Commonly known as: ZESTRIL ?Take 1 tablet (30 mg total) by mouth daily. ?  ?Magnesium 250 MG  Tabs ?Take 1 tablet by mouth daily. ?  ? ?  ? ? ? ?Objective:  ? ?BP 112/75   Pulse 72   Ht _0  (1.6 m)   Wt 120 lb (54.4 kg)   LMP  (LMP Unknown)   SpO2 95%   BMI 21.26 kg/m?   ?Wt Readings from Last 3 Encounters:  ?08/04/21 120 lb (54.4 kg)  ?02/03/21 125 lb (56.7 kg)  ?12/14/20 125 lb (56.7 kg)  ?  ?Physical Exam ?Vitals and nursing note reviewed.  ?Constitutional:   ?   General: She is not in acute distress. ?   Appearance: She is well-developed. She is not diaphoretic.  ?Eyes:  ?   Conjunctiva/sclera: Conjunctivae normal.  ?Cardiovascular:  ?   Rate and Rhythm: Normal rate and regular rhythm.  ?   Heart sounds: Normal heart sounds. No murmur heard. ?Pulmonary:  ?   Effort: Pulmonary effort is normal. No respiratory distress.  ?   Breath sounds: Normal breath sounds. No wheezing.  ?Musculoskeletal:     ?   General: No tenderness. Normal range of motion.  ?Skin: ?   General: Skin is warm and dry.  ?   Findings: No rash.  ?Neurological:  ?   Mental Status: She is alert and oriented to person, place, and time.  ?   Coordination: Coordination normal.  ?Psychiatric:     ?   Behavior: Behavior normal.  ? ? ? ? ?Assessment & Plan:  ? ?Problem List Items Addressed This Visit   ? ?  ? Cardiovascular and Mediastinum  ? Essential hypertension, benign  ? Relevant Orders  ? CBC with Differential/Platelet  ? CMP14+EGFR  ? Lipid panel  ? Bayer DCA Hb A1c Waived  ? TSH  ?  ? Other  ? HLD (hyperlipidemia) - Primary  ? Relevant Orders  ? CBC with Differential/Platelet  ? CMP14+EGFR  ? Lipid panel  ? Bayer DCA Hb A1c Waived  ? TSH  ? Prediabetes  ? Relevant Orders  ? CBC with Differential/Platelet  ? CMP14+EGFR  ? Lipid panel  ? Bayer DCA Hb A1c Waived  ? TSH  ? ?Other Visit Diagnoses   ? ? Need for hepatitis B booster vaccination      ? Relevant Orders  ? Hepatitis B vaccine adult IM (Completed)  ? ?  ?  ?Continue current medicine, seems to be doing well, we will see what blood work shows.  A1c looks good at 5.7.  Blood  pressure looks good today. ?Follow up plan: ?Return in about 3 months (around 11/04/2021), or if symptoms worsen or fail to improve, for Diabetes and hypertension. ? ?Counseling provided for all of the vaccine components ?Orders Placed This Encounter  ?Procedures  ? Hepatitis B vaccine adult IM  ? CBC with Differential/Platelet  ? CMP14+EGFR  ? Lipid panel  ? Bayer [facility]  Hb A1c Waived  ? TSH  ? ? ?Caryl Pina, MD ?[place] ?08/04/2021, 3:19 PM ? ? ?  ?

## 2021-08-05 LAB — CMP14+EGFR
ALT: 10 IU/L (ref 0–32)
AST: 15 IU/L (ref 0–40)
Albumin/Globulin Ratio: 1.6 (ref 1.2–2.2)
Albumin: 4.1 g/dL (ref 3.7–4.7)
Alkaline Phosphatase: 110 IU/L (ref 44–121)
BUN/Creatinine Ratio: 20 (ref 12–28)
BUN: 27 mg/dL (ref 8–27)
Bilirubin Total: 0.6 mg/dL (ref 0.0–1.2)
CO2: 23 mmol/L (ref 20–29)
Calcium: 9.7 mg/dL (ref 8.7–10.3)
Chloride: 100 mmol/L (ref 96–106)
Creatinine, Ser: 1.34 mg/dL — ABNORMAL HIGH (ref 0.57–1.00)
Globulin, Total: 2.6 g/dL (ref 1.5–4.5)
Glucose: 96 mg/dL (ref 70–99)
Potassium: 4.7 mmol/L (ref 3.5–5.2)
Sodium: 139 mmol/L (ref 134–144)
Total Protein: 6.7 g/dL (ref 6.0–8.5)
eGFR: 42 mL/min/{1.73_m2} — ABNORMAL LOW (ref >59)

## 2021-08-05 LAB — CBC WITH DIFFERENTIAL/PLATELET
Basophils Absolute: 0.1 10*3/uL (ref 0.0–0.2)
Basos: 1 %
EOS (ABSOLUTE): 0.2 10*3/uL (ref 0.0–0.4)
Eos: 3 %
Hematocrit: 34.5 % (ref 34.0–46.6)
Hemoglobin: 11.4 g/dL (ref 11.1–15.9)
Immature Grans (Abs): 0 10*3/uL (ref 0.0–0.1)
Immature Granulocytes: 0 %
Lymphocytes Absolute: 2.3 10*3/uL (ref 0.7–3.1)
Lymphs: 38 %
MCH: 30.4 pg (ref 26.6–33.0)
MCHC: 33 g/dL (ref 31.5–35.7)
MCV: 92 fL (ref 79–97)
Monocytes Absolute: 0.5 10*3/uL (ref 0.1–0.9)
Monocytes: 9 %
Neutrophils Absolute: 3 10*3/uL (ref 1.4–7.0)
Neutrophils: 49 %
Platelets: 304 10*3/uL (ref 150–450)
RBC: 3.75 x10E6/uL — ABNORMAL LOW (ref 3.77–5.28)
RDW: 11.8 % (ref 11.7–15.4)
WBC: 6 10*3/uL (ref 3.4–10.8)

## 2021-08-05 LAB — LIPID PANEL
Chol/HDL Ratio: 2.2 ratio (ref 0.0–4.4)
Cholesterol, Total: 141 mg/dL (ref 100–199)
HDL: 63 mg/dL (ref >39)
LDL Chol Calc (NIH): 66 mg/dL (ref 0–99)
Triglycerides: 57 mg/dL (ref 0–149)
VLDL Cholesterol Cal: 12 mg/dL (ref 5–40)

## 2021-08-05 LAB — TSH: TSH: 1.75 u[IU]/mL (ref 0.450–4.500)

## 2021-08-12 ENCOUNTER — Ambulatory Visit (INDEPENDENT_AMBULATORY_CARE_PROVIDER_SITE_OTHER): Payer: Medicare Other

## 2021-08-12 VITALS — Wt 120.0 lb

## 2021-08-12 DIAGNOSIS — Z0000 Encounter for general adult medical examination without abnormal findings: Secondary | ICD-10-CM | POA: Diagnosis not present
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
## 2021-08-12 NOTE — Patient Instructions (Addendum)
Pam Franklin , ?Thank you for taking time to come for your Medicare Wellness Visit. I appreciate your ongoing commitment to your health goals. Please review the following plan we discussed and let me know if I can assist you in the future.  ? ?Screening recommendations/referrals: ?Colonoscopy: done 09/13/2019 - no repeat requird ?Mammogram: Done 01/08/2021 - Repeat annually  ?Bone Density: Done 08/06/2018 - Repeat every 2 years *past due - have this done at your next visit ?Recommended yearly ophthalmology/optometry visit for glaucoma screening and checkup ?Recommended yearly dental visit for hygiene and checkup ? ?Vaccinations: ?Influenza vaccine: Done 03/09/2021 - Repeat annually ?Pneumococcal vaccine: Done 03/01/2014 & 08/06/2018 ?Tdap vaccine: done 02/28/2013 - Repeat in 10 years  ?Shingles vaccine: Done 08/06/2018 & 10/08/2018   ?Covid-19:Done 08/05/2019, 09/02/2019, & 04/07/2020 - for additional boosters, contact pharmacy ? ?Advanced directives: Advance directive discussed with you today. Even though you declined this today, please call our office should you change your mind, and we can give you the proper paperwork for you to fill out.  ? ?Conditions/risks identified: Keep up the great work! Aim for 30 minutes of exercise or brisk walking, 6-8 glasses of water, and 5 servings of fruits and vegetables each day.  ? ?Next appointment: Follow up in one year for your annual wellness visit  ? ? ?Preventive Care 63 Years and Older, Female ?Preventive care refers to lifestyle choices and visits with your health care provider that can promote health and wellness. ?What does preventive care include? ?A yearly physical exam. This is also called an annual well check. ?Dental exams once or twice a year. ?Routine eye exams. Ask your health care provider how often you should have your eyes checked. ?Personal lifestyle choices, including: ?Daily care of your teeth and gums. ?Regular physical activity. ?Eating a healthy diet. ?Avoiding  tobacco and drug use. ?Limiting alcohol use. ?Practicing safe sex. ?Taking low-dose aspirin every day. ?Taking vitamin and mineral supplements as recommended by your health care provider. ?What happens during an annual well check? ?The services and screenings done by your health care provider during your annual well check will depend on your age, overall health, lifestyle risk factors, and family history of disease. ?Counseling  ?Your health care provider may ask you questions about your: ?Alcohol use. ?Tobacco use. ?Drug use. ?Emotional well-being. ?Home and relationship well-being. ?Sexual activity. ?Eating habits. ?History of falls. ?Memory and ability to understand (cognition). ?Work and work Statistician. ?Reproductive health. ?Screening  ?You may have the following tests or measurements: ?Height, weight, and BMI. ?Blood pressure. ?Lipid and cholesterol levels. These may be checked every 5 years, or more frequently if you are over 34 years old. ?Skin check. ?Lung cancer screening. You may have this screening every year starting at age 11 if you have a 30-pack-year history of smoking and currently smoke or have quit within the past 15 years. ?Fecal occult blood test (FOBT) of the stool. You may have this test every year starting at age 65. ?Flexible sigmoidoscopy or colonoscopy. You may have a sigmoidoscopy every 5 years or a colonoscopy every 10 years starting at age 83. ?Hepatitis C blood test. ?Hepatitis B blood test. ?Sexually transmitted disease (STD) testing. ?Diabetes screening. This is done by checking your blood sugar (glucose) after you have not eaten for a while (fasting). You may have this done every 1-3 years. ?Bone density scan. This is done to screen for osteoporosis. You may have this done starting at age 63. ?Mammogram. This may be done every 1-2 years.  Talk to your health care provider about how often you should have regular mammograms. ?Talk with your health care provider about your test  results, treatment options, and if necessary, the need for more tests. ?Vaccines  ?Your health care provider may recommend certain vaccines, such as: ?Influenza vaccine. This is recommended every year. ?Tetanus, diphtheria, and acellular pertussis (Tdap, Td) vaccine. You may need a Td booster every 10 years. ?Zoster vaccine. You may need this after age 56. ?Pneumococcal 13-valent conjugate (PCV13) vaccine. One dose is recommended after age 89. ?Pneumococcal polysaccharide (PPSV23) vaccine. One dose is recommended after age 49. ?Talk to your health care provider about which screenings and vaccines you need and how often you need them. ?This information is not intended to replace advice given to you by your health care provider. Make sure you discuss any questions you have with your health care provider. ?Document Released: 06/12/2015 Document Revised: 02/03/2016 Document Reviewed: 03/17/2015 ?Elsevier Interactive Patient Education ? 2017 [place]. ? ?Fall Prevention in the Home ?Falls can cause injuries. They can happen to people of all ages. There are many things you can do to make your home safe and to help prevent falls. ?What can I do on the outside of my home? ?Regularly fix the edges of walkways and driveways and fix any cracks. ?Remove anything that might make you trip as you walk through a door, such as a raised step or threshold. ?Trim any bushes or trees on the path to your home. ?Use bright outdoor lighting. ?Clear any walking paths of anything that might make someone trip, such as rocks or tools. ?Regularly check to see if handrails are loose or broken. Make sure that both sides of any steps have handrails. ?Any raised decks and porches should have guardrails on the edges. ?Have any leaves, snow, or ice cleared regularly. ?Use sand or salt on walking paths during winter. ?Clean up any spills in your garage right away. This includes oil or grease spills. ?What can I do in the bathroom? ?Use night  lights. ?Install grab bars by the toilet and in the tub and shower. Do not use towel bars as grab bars. ?Use non-skid mats or decals in the tub or shower. ?If you need to sit down in the shower, use a plastic, non-slip stool. ?Keep the floor dry. Clean up any water that spills on the floor as soon as it happens. ?Remove soap buildup in the tub or shower regularly. ?Attach bath mats securely with double-sided non-slip rug tape. ?Do not have throw rugs and other things on the floor that can make you trip. ?What can I do in the bedroom? ?Use night lights. ?Make sure that you have a light by your bed that is easy to reach. ?Do not use any sheets or blankets that are too big for your bed. They should not hang down onto the floor. ?Have a firm chair that has side arms. You can use this for support while you get dressed. ?Do not have throw rugs and other things on the floor that can make you trip. ?What can I do in the kitchen? ?Clean up any spills right away. ?Avoid walking on wet floors. ?Keep items that you use a lot in easy-to-reach places. ?If you need to reach something above you, use a strong step stool that has a grab bar. ?Keep electrical cords out of the way. ?Do not use floor polish or wax that makes floors slippery. If you must use wax, use non-skid floor wax. ?  Do not have throw rugs and other things on the floor that can make you trip. ?What can I do with my stairs? ?Do not leave any items on the stairs. ?Make sure that there are handrails on both sides of the stairs and use them. Fix handrails that are broken or loose. Make sure that handrails are as long as the stairways. ?Check any carpeting to make sure that it is firmly attached to the stairs. Fix any carpet that is loose or worn. ?Avoid having throw rugs at the top or bottom of the stairs. If you do have throw rugs, attach them to the floor with carpet tape. ?Make sure that you have a light switch at the top of the stairs and the bottom of the stairs. If  you do not have them, ask someone to add them for you. ?What else can I do to help prevent falls? ?Wear shoes that: ?Do not have high heels. ?Have rubber bottoms. ?Are comfortable and fit you well. ?Are closed at the

## 2021-09-20 DIAGNOSIS — Z20822 Contact with and (suspected) exposure to covid-19: Secondary | ICD-10-CM | POA: Diagnosis not present

## 2021-10-04 DIAGNOSIS — Z20822 Contact with and (suspected) exposure to covid-19: Secondary | ICD-10-CM | POA: Diagnosis not present

## 2021-11-04 ENCOUNTER — Ambulatory Visit (INDEPENDENT_AMBULATORY_CARE_PROVIDER_SITE_OTHER): Payer: Medicare Other | Admitting: Family Medicine

## 2021-11-04 ENCOUNTER — Encounter: Payer: Self-pay | Admitting: Family Medicine

## 2021-11-04 VITALS — BP 147/66 | HR 53 | Temp 97.9°F | Ht 63.0 in | Wt 124.0 lb

## 2021-11-04 DIAGNOSIS — F32A Depression, unspecified: Secondary | ICD-10-CM

## 2021-11-04 DIAGNOSIS — E7800 Pure hypercholesterolemia, unspecified: Secondary | ICD-10-CM

## 2021-11-04 DIAGNOSIS — R7303 Prediabetes: Secondary | ICD-10-CM | POA: Diagnosis not present

## 2021-11-04 DIAGNOSIS — F419 Anxiety disorder, unspecified: Secondary | ICD-10-CM | POA: Diagnosis not present

## 2021-11-04 DIAGNOSIS — I10 Essential (primary) hypertension: Secondary | ICD-10-CM | POA: Diagnosis not present

## 2021-11-04 LAB — BAYER DCA HB A1C WAIVED: HB A1C (BAYER DCA - WAIVED): 6.1 % — ABNORMAL HIGH (ref 4.8–5.6)

## 2021-11-04 MED ORDER — ATORVASTATIN CALCIUM 20 MG PO TABS: 20.0000 mg | ORAL_TABLET | Freq: Every day | ORAL | 3 refills | Status: DC

## 2021-11-04 MED ORDER — ESCITALOPRAM OXALATE 10 MG PO TABS: 10.0000 mg | ORAL_TABLET | Freq: Every day | ORAL | 3 refills | Status: DC

## 2021-11-04 MED ORDER — LISINOPRIL 30 MG PO TABS: 30.0000 mg | ORAL_TABLET | Freq: Every day | ORAL | 3 refills | Status: DC

## 2021-11-04 MED ORDER — HYDROCHLOROTHIAZIDE 25 MG PO TABS: 25.0000 mg | ORAL_TABLET | Freq: Every day | ORAL | 3 refills | Status: DC

## 2021-11-04 NOTE — Progress Notes (Signed)
BP (!) 147/66   Pulse (!) 53   Temp 97.9 F (36.6 C)   Ht '5\' 3"'$  (1.6 m)   Wt 124 lb (56.2 kg)   LMP  (LMP Unknown)   SpO2 100%   BMI 21.97 kg/m    Subjective:   Patient ID: Pam Franklin, female    DOB: 1950/10/21, 71 y.o.   MRN: [id number]  HPI: Pam Franklin is a 71 y.o. female presenting on 11/04/2021 for Medical Management of Chronic Issues, Diabetes, Hypertension, and Hyperlipidemia   HPI Prediabetes Patient comes in today for recheck of his diabetes. Patient has been currently taking no medication and is diet controlled. Patient is currently on an ACE inhibitor/ARB. Patient has not seen an ophthalmologist this year. Patient denies any issues with their feet. The symptom started onset as an adult htn and hld ARE RELATED TO DM   Hypertension Patient is currently on lisinopril and hydrochlorothorazide, and their blood pressure today is 147/60. Patient denies any lightheadedness or dizziness. Patient denies headaches, blurred vision, chest pains, shortness of breath, or weakness. Denies any side effects from medication and is content with current medication.   Hyperlipidemia Patient is coming in for recheck of his hyperlipidemia. The patient is currently taking atorvastatin. They deny any issues with myalgias or history of liver damage from it. They deny any focal numbness or weakness or chest pain.   Relevant past medical, surgical, family and social history reviewed and updated as indicated. Interim medical history since our last visit reviewed. Allergies and medications reviewed and updated.  Review of Systems  Constitutional:  Negative for chills and fever.  Eyes:  Negative for visual disturbance.  Respiratory:  Negative for chest tightness and shortness of breath.   Cardiovascular:  Negative for chest pain and leg swelling.  Musculoskeletal:  Negative for back pain and gait problem.  Skin:  Negative for rash.  Neurological:  Negative for dizziness, light-headedness and  headaches.  Psychiatric/Behavioral:  Negative for agitation, behavioral problems, self-injury, sleep disturbance and suicidal ideas. The patient is not nervous/anxious.   All other systems reviewed and are negative.   Per HPI unless specifically indicated above   Allergies as of 11/04/2021       Reactions   Nitrofurantoin Nausea And Vomiting   REACTION: nausea and vomiting   Prednisone Nausea Only        Medication List        Accurate as of November 04, 2021  4:12 PM. If you have any questions, ask your nurse or doctor.          aspirin 81 MG tablet Take 81 mg by mouth daily.   atorvastatin 20 MG tablet Commonly known as: LIPITOR Take 1 tablet (20 mg total) by mouth daily.   azelaic acid 20 % cream Commonly known as: AZELEX Apply topically 2 (two) times daily. After skin is thoroughly washed and patted dry, gently but thoroughly massage a thin film of azelaic acid cream into the affected area twice daily, in the morning and evening.   Biotin 5 MG Caps Take 1 capsule by mouth daily.   Cetirizine HCl 10 MG Caps Take 10 mg by mouth daily.   Cinnamon 500 MG capsule Take 500 mg by mouth daily.   cycloSPORINE 0.05 % ophthalmic emulsion Commonly known as: RESTASIS 1 drop 2 (two) times daily.   escitalopram 10 MG tablet Commonly known as: LEXAPRO Take 1 tablet (10 mg total) by mouth daily.   estradiol 0.0375 MG/24HR Commonly  known as: VIVELLE-DOT Place 1 patch onto the skin 2 (two) times a week.   fluticasone 50 MCG/ACT nasal spray Commonly known as: FLONASE Place 1 spray into both nostrils 2 (two) times daily as needed for allergies or rhinitis.   hydrochlorothiazide 25 MG tablet Commonly known as: HYDRODIURIL Take 1 tablet (25 mg total) by mouth daily.   L-Lysine 1000 MG Tabs Take 1 tablet by mouth daily.   lisinopril 30 MG tablet Commonly known as: ZESTRIL Take 1 tablet (30 mg total) by mouth daily.   Magnesium 250 MG Tabs Take 1 tablet by mouth  daily.   Oyster Shell Calcium w/D 500-5 MG-MCG Tabs Take 1 tablet by mouth every morning.         Objective:   BP (!) 147/66   Pulse (!) 53   Temp 97.9 F (36.6 C)   Ht '5\' 3"'$  (1.6 m)   Wt 124 lb (56.2 kg)   LMP  (LMP Unknown)   SpO2 100%   BMI 21.97 kg/m   Wt Readings from Last 3 Encounters:  11/04/21 124 lb (56.2 kg)  08/12/21 120 lb (54.4 kg)  08/04/21 120 lb (54.4 kg)    Physical Exam Vitals and nursing note reviewed.  Constitutional:      General: She is not in acute distress.    Appearance: She is well-developed. She is not diaphoretic.  Eyes:     Conjunctiva/sclera: Conjunctivae normal.  Cardiovascular:     Rate and Rhythm: Normal rate and regular rhythm.     Heart sounds: Normal heart sounds. No murmur heard. Pulmonary:     Effort: Pulmonary effort is normal. No respiratory distress.     Breath sounds: Normal breath sounds. No wheezing.  Musculoskeletal:        General: No tenderness. Normal range of motion.  Skin:    General: Skin is warm and dry.     Findings: No rash.  Neurological:     Mental Status: She is alert and oriented to person, place, and time.     Coordination: Coordination normal.  Psychiatric:        Behavior: Behavior normal.       Assessment & Plan:   Problem List Items Addressed This Visit       Cardiovascular and Mediastinum   Essential hypertension, benign   Relevant Medications   hydrochlorothiazide (HYDRODIURIL) 25 MG tablet   lisinopril (ZESTRIL) 30 MG tablet   atorvastatin (LIPITOR) 20 MG tablet     Other   HLD (hyperlipidemia)   Relevant Medications   hydrochlorothiazide (HYDRODIURIL) 25 MG tablet   lisinopril (ZESTRIL) 30 MG tablet   atorvastatin (LIPITOR) 20 MG tablet   Prediabetes - Primary   Relevant Orders   Bayer DCA Hb A1c Waived   Anxiety and depression   Relevant Medications   escitalopram (LEXAPRO) 10 MG tablet    Continue current medicine, seems to be doing well. Follow up plan: Return in  about 3 months (around 02/04/2022), or if symptoms worsen or fail to improve, for Prediabetes hypertension".  Counseling provided for all of the vaccine components Orders Placed This Encounter  Procedures   Bayer [place] Hb A1c [place] Keniyah Gelinas, MD [facility] 11/04/2021, 4:12 PM

## 2022-01-21 DIAGNOSIS — Z1231 Encounter for screening mammogram for malignant neoplasm of breast: Secondary | ICD-10-CM | POA: Diagnosis not present

## 2022-02-03 ENCOUNTER — Encounter: Payer: Self-pay | Admitting: Family Medicine

## 2022-02-03 ENCOUNTER — Ambulatory Visit (INDEPENDENT_AMBULATORY_CARE_PROVIDER_SITE_OTHER): Payer: Medicare Other | Admitting: Family Medicine

## 2022-02-03 VITALS — BP 111/67 | HR 54 | Temp 97.6°F | Ht 63.0 in | Wt 121.0 lb

## 2022-02-03 DIAGNOSIS — E7800 Pure hypercholesterolemia, unspecified: Secondary | ICD-10-CM | POA: Diagnosis not present

## 2022-02-03 DIAGNOSIS — I10 Essential (primary) hypertension: Secondary | ICD-10-CM | POA: Diagnosis not present

## 2022-02-03 DIAGNOSIS — R7303 Prediabetes: Secondary | ICD-10-CM | POA: Diagnosis not present

## 2022-02-03 LAB — BAYER DCA HB A1C WAIVED: HB A1C (BAYER DCA - WAIVED): 5.8 % — ABNORMAL HIGH (ref 4.8–5.6)

## 2022-02-03 NOTE — Progress Notes (Signed)
BP 111/67   Pulse (!) 54   Temp 97.6 F (36.4 C)   Ht _0  (1.6 m)   Wt 121 lb (54.9 kg)   LMP  (LMP Unknown)   SpO2 98%   BMI 21.43 kg/m    Subjective:   Patient ID: Pam Franklin, female    DOB: 1950-09-03, 71 y.o.   MRN: [id number]  HPI: Pam Franklin is a 71 y.o. female presenting on 02/03/2022 for Medical Management of Chronic Issues, Hyperlipidemia, Hypertension, and Prediabetes   HPI Prediabetes Patient comes in today for recheck of his diabetes. Patient has been currently taking no medication and has been diet controlled. Patient is currently on an ACE inhibitor/ARB. Patient has not seen an ophthalmologist this year. Patient denies any issues with their feet. The symptom started onset as an adult hypertension and hyperlipidemia ARE RELATED TO DM   Hypertension Patient is currently on hydrochlorothiazide and lisinopril, and their blood pressure today is 111/67. Patient denies any lightheadedness or dizziness. Patient denies headaches, blurred vision, chest pains, shortness of breath, or weakness. Denies any side effects from medication and is content with current medication.   Hyperlipidemia Patient is coming in for recheck of his hyperlipidemia. The patient is currently taking atorvastatin. They deny any issues with myalgias or history of liver damage from it. They deny any focal numbness or weakness or chest pain.   Patient is taking Lexapro for anxiety and depression.  She says she started right now because one of the grandkids that she has custody of currently in the mental Institute because he is facing PTSD because of trauma that he has been exposed to.  Relevant past medical, surgical, family and social history reviewed and updated as indicated. Interim medical history since our last visit reviewed. Allergies and medications reviewed and updated.  Review of Systems  Constitutional:  Negative for chills and fever.  Eyes:  Negative for visual disturbance.   Respiratory:  Negative for chest tightness and shortness of breath.   Cardiovascular:  Negative for chest pain and leg swelling.  Musculoskeletal:  Negative for back pain and gait problem.  Skin:  Negative for rash.  Neurological:  Negative for dizziness, light-headedness and headaches.  Psychiatric/Behavioral:  Negative for agitation and behavioral problems.   All other systems reviewed and are negative.   Per HPI unless specifically indicated above   Allergies as of 02/03/2022       Reactions   Nitrofurantoin Nausea And Vomiting   REACTION: nausea and vomiting   Prednisone Nausea Only        Medication List        Accurate as of February 03, 2022  3:28 PM. If you have any questions, ask your nurse or doctor.          aspirin 81 MG tablet Take 81 mg by mouth daily.   atorvastatin 20 MG tablet Commonly known as: LIPITOR Take 1 tablet (20 mg total) by mouth daily.   azelaic acid 20 % cream Commonly known as: AZELEX Apply topically 2 (two) times daily. After skin is thoroughly washed and patted dry, gently but thoroughly massage a thin film of azelaic acid cream into the affected area twice daily, in the morning and evening.   Biotin 5 MG Caps Take 1 capsule by mouth daily.   Cetirizine HCl 10 MG Caps Take 10 mg by mouth daily.   Cinnamon 500 MG capsule Take 500 mg by mouth daily.   cycloSPORINE 0.05 % ophthalmic emulsion  Commonly known as: RESTASIS 1 drop 2 (two) times daily.   escitalopram 10 MG tablet Commonly known as: LEXAPRO Take 1 tablet (10 mg total) by mouth daily.   estradiol 0.0375 MG/24HR Commonly known as: VIVELLE-DOT Place 1 patch onto the skin 2 (two) times a week.   fluticasone 50 MCG/ACT nasal spray Commonly known as: FLONASE Place 1 spray into both nostrils 2 (two) times daily as needed for allergies or rhinitis.   hydrochlorothiazide 25 MG tablet Commonly known as: HYDRODIURIL Take 1 tablet (25 mg total) by mouth daily.    L-Lysine 1000 MG Tabs Take 1 tablet by mouth daily.   lisinopril 30 MG tablet Commonly known as: ZESTRIL Take 1 tablet (30 mg total) by mouth daily.   Magnesium 250 MG Tabs Take 1 tablet by mouth daily.   Oyster Shell Calcium w/D 500-5 MG-MCG Tabs Take 1 tablet by mouth every morning.         Objective:   BP 111/67   Pulse (!) 54   Temp 97.6 F (36.4 C)   Ht _0  (1.6 m)   Wt 121 lb (54.9 kg)   LMP  (LMP Unknown)   SpO2 98%   BMI 21.43 kg/m   Wt Readings from Last 3 Encounters:  02/03/22 121 lb (54.9 kg)  11/04/21 124 lb (56.2 kg)  08/12/21 120 lb (54.4 kg)    Physical Exam Vitals and nursing note reviewed.  Constitutional:      General: She is not in acute distress.    Appearance: She is well-developed. She is not diaphoretic.  Eyes:     Conjunctiva/sclera: Conjunctivae normal.  Cardiovascular:     Rate and Rhythm: Normal rate and regular rhythm.     Heart sounds: Normal heart sounds. No murmur heard. Pulmonary:     Effort: Pulmonary effort is normal. No respiratory distress.     Breath sounds: Normal breath sounds. No wheezing.  Musculoskeletal:        General: No swelling or tenderness. Normal range of motion.  Skin:    General: Skin is warm and dry.     Findings: No rash.  Neurological:     Mental Status: She is alert and oriented to person, place, and time.     Coordination: Coordination normal.  Psychiatric:        Behavior: Behavior normal.       Assessment & Plan:   Problem List Items Addressed This Visit       Cardiovascular and Mediastinum   Essential hypertension, benign   Relevant Orders   CBC with Differential/Platelet   CMP14+EGFR   Lipid panel   Bayer DCA Hb A1c Waived     Other   HLD (hyperlipidemia)   Relevant Orders   CBC with Differential/Platelet   CMP14+EGFR   Lipid panel   Bayer DCA Hb A1c Waived   Prediabetes - Primary   Relevant Orders   CBC with Differential/Platelet   CMP14+EGFR   Lipid panel   Bayer  DCA Hb A1c Waived    A1c is 5.8 looks good, blood pressure looks good, everything looks good, no changes.  She says right now she does not want to increase the anxiety depression medicine but has been okay but she does deal with a lot of hardship but we will readdress in the future Follow up plan: Return in about 3 months (around 05/05/2022), or if symptoms worsen or fail to improve, for Prediabetes hypertension and cholesterol.  Counseling provided for all of the vaccine  components Orders Placed This Encounter  Procedures   CBC with Differential/Platelet   CMP14+EGFR   Lipid panel   Bayer DCA Hb A1c Wilder, MD [facility] 02/03/2022, 3:28 PM

## 2022-02-04 LAB — CBC WITH DIFFERENTIAL/PLATELET
Basophils Absolute: 0 10*3/uL (ref 0.0–0.2)
Basos: 1 %
EOS (ABSOLUTE): 0.1 10*3/uL (ref 0.0–0.4)
Eos: 2 %
Hematocrit: 33.7 % — ABNORMAL LOW (ref 34.0–46.6)
Hemoglobin: 11.4 g/dL (ref 11.1–15.9)
Immature Grans (Abs): 0 10*3/uL (ref 0.0–0.1)
Immature Granulocytes: 0 %
Lymphocytes Absolute: 1.8 10*3/uL (ref 0.7–3.1)
Lymphs: 31 %
MCH: 31.5 pg (ref 26.6–33.0)
MCHC: 33.8 g/dL (ref 31.5–35.7)
MCV: 93 fL (ref 79–97)
Monocytes Absolute: 0.6 10*3/uL (ref 0.1–0.9)
Monocytes: 10 %
Neutrophils Absolute: 3.3 10*3/uL (ref 1.4–7.0)
Neutrophils: 56 %
Platelets: 255 10*3/uL (ref 150–450)
RBC: 3.62 x10E6/uL — ABNORMAL LOW (ref 3.77–5.28)
RDW: 13 % (ref 11.7–15.4)
WBC: 5.8 10*3/uL (ref 3.4–10.8)

## 2022-02-04 LAB — CMP14+EGFR
ALT: 14 IU/L (ref 0–32)
AST: 19 IU/L (ref 0–40)
Albumin/Globulin Ratio: 2 (ref 1.2–2.2)
Albumin: 4.3 g/dL (ref 3.8–4.8)
Alkaline Phosphatase: 96 IU/L (ref 44–121)
BUN/Creatinine Ratio: 17 (ref 12–28)
BUN: 27 mg/dL (ref 8–27)
Bilirubin Total: 0.7 mg/dL (ref 0.0–1.2)
CO2: 22 mmol/L (ref 20–29)
Calcium: 9.8 mg/dL (ref 8.7–10.3)
Chloride: 105 mmol/L (ref 96–106)
Creatinine, Ser: 1.57 mg/dL — ABNORMAL HIGH (ref 0.57–1.00)
Globulin, Total: 2.2 g/dL (ref 1.5–4.5)
Glucose: 90 mg/dL (ref 70–99)
Potassium: 4.4 mmol/L (ref 3.5–5.2)
Sodium: 142 mmol/L (ref 134–144)
Total Protein: 6.5 g/dL (ref 6.0–8.5)
eGFR: 35 mL/min/{1.73_m2} — ABNORMAL LOW (ref >59)

## 2022-02-04 LAB — LIPID PANEL
Chol/HDL Ratio: 1.9 ratio (ref 0.0–4.4)
Cholesterol, Total: 131 mg/dL (ref 100–199)
HDL: 68 mg/dL (ref >39)
LDL Chol Calc (NIH): 52 mg/dL (ref 0–99)
Triglycerides: 48 mg/dL (ref 0–149)
VLDL Cholesterol Cal: 11 mg/dL (ref 5–40)

## 2022-02-12 DIAGNOSIS — B9689 Other specified bacterial agents as the cause of diseases classified elsewhere: Secondary | ICD-10-CM | POA: Diagnosis not present

## 2022-02-12 DIAGNOSIS — J988 Other specified respiratory disorders: Secondary | ICD-10-CM | POA: Diagnosis not present

## 2022-02-12 DIAGNOSIS — Z20822 Contact with and (suspected) exposure to covid-19: Secondary | ICD-10-CM | POA: Diagnosis not present

## 2022-03-08 DIAGNOSIS — Z23 Encounter for immunization: Secondary | ICD-10-CM | POA: Diagnosis not present

## 2022-04-06 ENCOUNTER — Encounter: Payer: Self-pay | Admitting: Family Medicine

## 2022-04-06 ENCOUNTER — Ambulatory Visit (INDEPENDENT_AMBULATORY_CARE_PROVIDER_SITE_OTHER): Payer: Medicare Other | Admitting: Family Medicine

## 2022-04-06 DIAGNOSIS — J069 Acute upper respiratory infection, unspecified: Secondary | ICD-10-CM

## 2022-04-06 MED ORDER — PROMETHAZINE-DM 6.25-15 MG/5ML PO SYRP: 2.5000 mL | ORAL_SOLUTION | Freq: Four times a day (QID) | ORAL | 0 refills | Status: DC | PRN

## 2022-04-06 MED ORDER — BENZONATATE 100 MG PO CAPS: 100.0000 mg | ORAL_CAPSULE | Freq: Three times a day (TID) | ORAL | 0 refills | Status: DC | PRN
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
## 2022-04-06 NOTE — Patient Instructions (Signed)

## 2022-05-06 ENCOUNTER — Encounter: Payer: Self-pay | Admitting: Family Medicine

## 2022-05-06 ENCOUNTER — Ambulatory Visit (INDEPENDENT_AMBULATORY_CARE_PROVIDER_SITE_OTHER): Payer: Medicare Other | Admitting: Family Medicine

## 2022-05-06 VITALS — BP 125/60 | HR 66 | Temp 97.1°F | Ht 63.0 in | Wt 119.0 lb

## 2022-05-06 DIAGNOSIS — R7303 Prediabetes: Secondary | ICD-10-CM

## 2022-05-06 DIAGNOSIS — E7800 Pure hypercholesterolemia, unspecified: Secondary | ICD-10-CM

## 2022-05-06 DIAGNOSIS — M85859 Other specified disorders of bone density and structure, unspecified thigh: Secondary | ICD-10-CM

## 2022-05-06 DIAGNOSIS — I10 Essential (primary) hypertension: Secondary | ICD-10-CM | POA: Diagnosis not present

## 2022-05-06 LAB — BAYER DCA HB A1C WAIVED: HB A1C (BAYER DCA - WAIVED): 6.6 % — ABNORMAL HIGH (ref 4.8–5.6)

## 2022-05-06 NOTE — Progress Notes (Signed)
BP 125/60   Pulse 66   Temp (!) 97.1 F (36.2 C)   Ht _0  (1.6 m)   Wt 119 lb (54 kg)   LMP  (LMP Unknown)   SpO2 94%   BMI 21.08 kg/m    Subjective:   Patient ID: Pam Franklin, female    DOB: 01-01-1951, 71 y.o.   MRN: [id number]  HPI: Pam Franklin is a 71 y.o. female presenting on 05/06/2022 for Medical Management of Chronic Issues, Prediabetes, and Shoulder Pain (right)   HPI Prediabetes Patient comes in today for recheck of his diabetes. Patient has been currently taking no medication currently. Patient is currently on an ACE inhibitor/ARB. Patient has seen an ophthalmologist this year. Patient denies any issues with their feet. The symptom started onset as an adult hypertension and hyperlipidemia ARE RELATED TO DM   Hypertension Patient is currently on lisinopril and hydrochlorothiazide, and their blood pressure today is 125/60. Patient says she gets the occasional lightheadedness, significantly when she goes from a laying position to a standing.  She has not checked her blood pressure during those times.. Patient denies headaches, blurred vision, chest pains, shortness of breath, or weakness. Denies any side effects from medication and is content with current medication.   Hyperlipidemia Patient is coming in for recheck of his hyperlipidemia. The patient is currently taking atorvastatin and Sinemet. They deny any issues with myalgias or history of liver damage from it. They deny any focal numbness or weakness or chest pain.   Osteoporosis/osteopenia Fractures or history of fracture: None Medication: Calcium and vitamin D Duration of treatment: 2 years Last bone density scan: 2 years ago Last T score: -2.3  Right shoulder pain Patient complains of some right shoulder pain and irritation that Alzheimer's does come and help.  Hurts more with reaching overhead and across body.  This is not severe but just more annoying it has been there for about a month she is not really  taking anything for it Get it checked out.  Relevant past medical, surgical, family and social history reviewed and updated as indicated. Interim medical history since our last visit reviewed. Allergies and medications reviewed and updated.  Review of Systems  Constitutional:  Negative for chills and fever.  HENT:  Positive for congestion. Negative for ear discharge and ear pain.   Eyes:  Negative for redness and visual disturbance.  Respiratory:  Positive for cough. Negative for chest tightness and shortness of breath.   Cardiovascular:  Negative for chest pain and leg swelling.  Genitourinary:  Negative for difficulty urinating and dysuria.  Musculoskeletal:  Positive for arthralgias. Negative for back pain and gait problem.  Skin:  Negative for rash.  Neurological:  Negative for light-headedness and headaches.  Psychiatric/Behavioral:  Negative for agitation and behavioral problems.   All other systems reviewed and are negative.   Per HPI unless specifically indicated above   Allergies as of 05/06/2022       Reactions   Codeine Other (See Comments)   Makes her irritable   Nitrofurantoin Nausea And Vomiting   REACTION: nausea and vomiting   Prednisone Nausea Only        Medication List        Accurate as of May 06, 2022  3:43 PM. If you have any questions, ask your nurse or doctor.          aspirin 81 MG tablet Take 81 mg by mouth daily.   atorvastatin 20 MG tablet Commonly  known as: LIPITOR Take 1 tablet (20 mg total) by mouth daily.   azelaic acid 20 % cream Commonly known as: AZELEX Apply topically 2 (two) times daily. After skin is thoroughly washed and patted dry, gently but thoroughly massage a thin film of azelaic acid cream into the affected area twice daily, in the morning and evening.   benzonatate 100 MG capsule Commonly known as: Tessalon Perles Take 1 capsule (100 mg total) by mouth 3 (three) times daily as needed for cough.   Biotin 5 MG  Caps Take 1 capsule by mouth daily.   Cetirizine HCl 10 MG Caps Take 10 mg by mouth daily.   Cinnamon 500 MG capsule Take 500 mg by mouth daily.   cycloSPORINE 0.05 % ophthalmic emulsion Commonly known as: RESTASIS 1 drop 2 (two) times daily.   escitalopram 10 MG tablet Commonly known as: LEXAPRO Take 1 tablet (10 mg total) by mouth daily.   estradiol 0.0375 MG/24HR Commonly known as: VIVELLE-DOT Place 1 patch onto the skin 2 (two) times a week.   fluticasone 50 MCG/ACT nasal spray Commonly known as: FLONASE Place 1 spray into both nostrils 2 (two) times daily as needed for allergies or rhinitis.   hydrochlorothiazide 25 MG tablet Commonly known as: HYDRODIURIL Take 1 tablet (25 mg total) by mouth daily.   L-Lysine 1000 MG Tabs Take 1 tablet by mouth daily.   lisinopril 30 MG tablet Commonly known as: ZESTRIL Take 1 tablet (30 mg total) by mouth daily.   Magnesium 250 MG Tabs Take 1 tablet by mouth daily.   Oyster Shell Calcium w/D 500-5 MG-MCG Tabs Take 1 tablet by mouth every morning.   promethazine-dextromethorphan 6.25-15 MG/5ML syrup Commonly known as: PROMETHAZINE-DM Take 2.5 mLs by mouth 4 (four) times daily as needed for cough.         Objective:   BP 125/60   Pulse 66   Temp (!) 97.1 F (36.2 C)   Ht _0  (1.6 m)   Wt 119 lb (54 kg)   LMP  (LMP Unknown)   SpO2 94%   BMI 21.08 kg/m   Wt Readings from Last 3 Encounters:  05/06/22 119 lb (54 kg)  02/03/22 121 lb (54.9 kg)  11/04/21 124 lb (56.2 kg)    Physical Exam Vitals and nursing note reviewed.  Constitutional:      General: She is not in acute distress.    Appearance: Normal appearance. She is well-developed. She is not diaphoretic.  Eyes:     Conjunctiva/sclera: Conjunctivae normal.  Cardiovascular:     Rate and Rhythm: Normal rate and regular rhythm.     Heart sounds: Normal heart sounds. No murmur heard. Pulmonary:     Effort: Pulmonary effort is normal. No respiratory  distress.     Breath sounds: Normal breath sounds. No wheezing.  Musculoskeletal:        General: No swelling or tenderness. Normal range of motion.  Skin:    General: Skin is warm and dry.     Findings: No rash.  Neurological:     Mental Status: She is alert and oriented to person, place, and time.     Coordination: Coordination normal.  Psychiatric:        Behavior: Behavior normal.       Assessment & Plan:   Problem List Items Addressed This Visit       Cardiovascular and Mediastinum   Essential hypertension, benign     Musculoskeletal and Integument   Osteopenia  Relevant Orders   DG [facility] DEXA     Other   HLD (hyperlipidemia)   Prediabetes - Primary   Relevant Orders   Bayer DCA Hb A1c Waived   BMP8+EGFR    A1c is up slightly at 6.6, she has been category more diabetes.  Discussed diabetes and diet.  No change in medication at this point. Follow up plan: Return in about 3 months (around 08/05/2022), or if symptoms worsen or fail to improve, for Hypertension and prediabetes.  Counseling provided for all of the vaccine components Orders Placed This Encounter  Procedures   DG [facility] DEXA   Bayer [place] Hb A1c Waived   BMP8+EGFR    Caryl Pina, MD [facility] 05/06/2022, 3:43 PM

## 2022-05-19 ENCOUNTER — Other Ambulatory Visit: Payer: Self-pay | Admitting: Family Medicine

## 2022-05-19 DIAGNOSIS — M85859 Other specified disorders of bone density and structure, unspecified thigh: Secondary | ICD-10-CM

## 2022-06-15 ENCOUNTER — Encounter: Payer: Self-pay | Admitting: Family Medicine

## 2022-06-23 ENCOUNTER — Encounter: Payer: Self-pay | Admitting: Family Medicine

## 2022-08-08 ENCOUNTER — Ambulatory Visit (INDEPENDENT_AMBULATORY_CARE_PROVIDER_SITE_OTHER): Payer: Medicare Other | Admitting: Family Medicine

## 2022-08-08 ENCOUNTER — Ambulatory Visit: Payer: Medicare Other

## 2022-08-08 ENCOUNTER — Encounter: Payer: Self-pay | Admitting: Family Medicine

## 2022-08-08 VITALS — BP 137/63 | HR 59 | Ht 63.0 in | Wt 116.0 lb

## 2022-08-08 DIAGNOSIS — I10 Essential (primary) hypertension: Secondary | ICD-10-CM | POA: Diagnosis not present

## 2022-08-08 DIAGNOSIS — R5383 Other fatigue: Secondary | ICD-10-CM | POA: Diagnosis not present

## 2022-08-08 DIAGNOSIS — R7303 Prediabetes: Secondary | ICD-10-CM

## 2022-08-08 DIAGNOSIS — F419 Anxiety disorder, unspecified: Secondary | ICD-10-CM | POA: Diagnosis not present

## 2022-08-08 DIAGNOSIS — E7800 Pure hypercholesterolemia, unspecified: Secondary | ICD-10-CM

## 2022-08-08 DIAGNOSIS — F32A Depression, unspecified: Secondary | ICD-10-CM | POA: Diagnosis not present

## 2022-08-08 LAB — BAYER DCA HB A1C WAIVED: HB A1C (BAYER DCA - WAIVED): 6 % — ABNORMAL HIGH (ref 4.8–5.6)

## 2022-08-08 MED ORDER — ESCITALOPRAM OXALATE 10 MG PO TABS: 10.0000 mg | ORAL_TABLET | Freq: Every day | ORAL | 3 refills | Status: DC

## 2022-08-08 MED ORDER — HYDROCHLOROTHIAZIDE 25 MG PO TABS: 25.0000 mg | ORAL_TABLET | Freq: Every day | ORAL | 3 refills | Status: DC

## 2022-08-08 MED ORDER — ATORVASTATIN CALCIUM 20 MG PO TABS: 20.0000 mg | ORAL_TABLET | Freq: Every day | ORAL | 3 refills | Status: DC

## 2022-08-08 MED ORDER — LISINOPRIL 30 MG PO TABS: 30.0000 mg | ORAL_TABLET | Freq: Every day | ORAL | 3 refills | Status: DC

## 2022-08-08 NOTE — Progress Notes (Signed)
BP 137/63   Pulse (!) 59   Ht 5\' 3"  (1.6 m)   Wt 52.6 kg   LMP  (LMP Unknown)   SpO2 100%   BMI 20.55 kg/m    Subjective:   Patient ID: Pam Franklin, female    DOB: [birth year], 72 y.o.   MRN: [id number]  HPI: Pam Franklin is a 72 y.o. female presenting on 08/08/2022 for Medical Management of Chronic Issues, Hyperlipidemia, Hypertension, Prediabetes, Fatigue, and Cough  Hypertension Patient is coming in for a recheck of their hypertension. Patient is currently taking Lisinopril and HCTZ. Patient's blood pressure today is 137/63. Patient has been taking their blood pressure at home. Average blood pressure readings at home are 135/65. Patient denies lightheadedness, dizziness, weakness, blurred vision, chest pains, and shortness of breath. Patient states she has has been having more headaches in the past 2 weeks but has also been waking up 4-5 times per night due to her husband keeping the house temperature at 84 degrees. Patient denies any side effects from medication(s) and is content with current medication(s).   Prediabetes Patient comes in today for recheck of their prediabetes. Patient does not currently take any medications. Patient is currently taking an ACE inhibitor/ARB. Patient has seen an ophthalmologist this year. Patient's last ophthalmology exam was February 2024 and normal. Patient denies any issues with their feet. The symptom started onset as an adult. Hypertension and hyperlipidemia are related to prediabetes.  Hyperlipidemia Patient is coming in for recheck of her hyperlipidemia. The patient is currently taking Atorvastatin. They deny any issues with myalgias or history of liver damage from it. They deny any focal numbness or weakness or chest pain.   Anxiety and depression recheck Patient is coming in for anxiety and depression recheck. Patient is currently taking Lexapro 10 mg. Patient states she has been splitting the tablet in half, taking 5 mg in the morning and 5  mg at night to help with persistent fatigue. Patient states the medication is working well with an occasional drop in mood, but does not want to increase the dose.  Patient also mentions a persistent, dry cough that she states she has had since she had COVID 1 year ago. Patient has been using Delsym and Tessalon pearls with some relief.  Relevant past medical, surgical, family and social history were reviewed and updated as indicated. Interim medical history were reviewed. Allergies and medications were reviewed and updated.  Review of Systems  Constitutional:  Positive for fatigue. Negative for chills and fever.  HENT:  Negative for congestion.   Eyes:  Negative for photophobia and visual disturbance.  Respiratory:  Positive for cough. Negative for chest tightness and shortness of breath.   Cardiovascular:  Negative for chest pain and leg swelling.  Gastrointestinal:  Negative for abdominal pain.  Musculoskeletal:  Negative for back pain and myalgias.  Skin:  Negative for rash.  Neurological:  Positive for headaches. Negative for dizziness, weakness, light-headedness and numbness.  Hematological: Negative.   Psychiatric/Behavioral:  Positive for sleep disturbance. Negative for agitation, behavioral problems and suicidal ideas. The patient is not nervous/anxious.   All other systems reviewed and are negative.   Per HPI unless specifically indicated above.   Allergies as of 08/08/2022       Reactions   Codeine Other (See Comments)   Makes her irritable   Nitrofurantoin Nausea And Vomiting   REACTION: nausea and vomiting   Prednisone Nausea Only        Medication List  Accurate as of August 08, 2022  8:52 AM. If you have any questions, ask your nurse or doctor.          STOP taking these medications    benzonatate 100 MG capsule Commonly known as: Best boy Stopped by: Worthy Rancher, MD   promethazine-dextromethorphan 6.25-15 MG/5ML syrup Commonly  known as: PROMETHAZINE-DM Stopped by: Fransisca Kaufmann Kita Neace, MD       TAKE these medications    aspirin 81 MG tablet Take 81 mg by mouth daily.   atorvastatin 20 MG tablet Commonly known as: LIPITOR Take 1 tablet (20 mg total) by mouth daily.   azelaic acid 20 % cream Commonly known as: AZELEX Apply topically 2 (two) times daily. After skin is thoroughly washed and patted dry, gently but thoroughly massage a thin film of azelaic acid cream into the affected area twice daily, in the morning and evening.   Biotin 5 MG Caps Take 1 capsule by mouth daily.   Cetirizine HCl 10 MG Caps Take 10 mg by mouth daily.   Cinnamon 500 MG capsule Take 500 mg by mouth daily.   cycloSPORINE 0.05 % ophthalmic emulsion Commonly known as: RESTASIS 1 drop 2 (two) times daily.   escitalopram 10 MG tablet Commonly known as: LEXAPRO Take 1 tablet (10 mg total) by mouth daily.   estradiol 0.0375 MG/24HR Commonly known as: VIVELLE-DOT Place 1 patch onto the skin 2 (two) times a week.   fluticasone 50 MCG/ACT nasal spray Commonly known as: FLONASE Place 1 spray into both nostrils 2 (two) times daily as needed for allergies or rhinitis.   hydrochlorothiazide 25 MG tablet Commonly known as: HYDRODIURIL Take 1 tablet (25 mg total) by mouth daily.   L-Lysine 1000 MG Tabs Take 1 tablet by mouth daily.   lisinopril 30 MG tablet Commonly known as: ZESTRIL Take 1 tablet (30 mg total) by mouth daily.   Magnesium 250 MG Tabs Take 1 tablet by mouth daily.   Oyster Shell Calcium w/D 500-5 MG-MCG Tabs TAKE 1 TABLET BY MOUTH EVERY DAY WITH BREAKFAST         Objective:   BP 137/63   Pulse (!) 59   Ht '5\' 3"'$  (1.6 m)   Wt 52.6 kg   LMP  (LMP Unknown)   SpO2 100%   BMI 20.55 kg/m   Wt Readings from Last 3 Encounters:  08/08/22 52.6 kg  05/06/22 54 kg  02/03/22 54.9 kg    Physical Exam Vitals reviewed.  Constitutional:      Appearance: Normal appearance.  Neck:     Thyroid: No  thyromegaly.  Cardiovascular:     Rate and Rhythm: Normal rate and regular rhythm.     Heart sounds: Normal heart sounds.  Pulmonary:     Effort: Pulmonary effort is normal.     Breath sounds: Normal breath sounds.  Abdominal:     Tenderness: There is no right CVA tenderness or left CVA tenderness.  Musculoskeletal:     Cervical back: Neck supple. No tenderness.     Right lower leg: No edema.     Left lower leg: No edema.  Lymphadenopathy:     Cervical: No cervical adenopathy.  Skin:    General: Skin is warm and dry.  Neurological:     Mental Status: She is alert and oriented to person, place, and time.     Gait: Gait normal.  Psychiatric:        Mood and Affect: Mood normal.  Behavior: Behavior normal.        Thought Content: Thought content normal.        Judgment: Judgment normal.     Assessment & Plan:   Problem List Items Addressed This Visit       Cardiovascular and Mediastinum   Essential hypertension, benign   Relevant Medications   hydrochlorothiazide (HYDRODIURIL) 25 MG tablet   lisinopril (ZESTRIL) 30 MG tablet   atorvastatin (LIPITOR) 20 MG tablet   Other Relevant Orders   CBC with Differential/Platelet (Completed)   CMP14+EGFR (Completed)   Lipid panel (Completed)   Bayer DCA Hb A1c Waived (Completed)     Other   HLD (hyperlipidemia)   Relevant Medications   hydrochlorothiazide (HYDRODIURIL) 25 MG tablet   lisinopril (ZESTRIL) 30 MG tablet   atorvastatin (LIPITOR) 20 MG tablet   Prediabetes - Primary   Relevant Orders   CBC with Differential/Platelet (Completed)   CMP14+EGFR (Completed)   Lipid panel (Completed)   Bayer DCA Hb A1c Waived (Completed)   Anxiety and depression   Relevant Medications   escitalopram (LEXAPRO) 10 MG tablet   Other Visit Diagnoses     Other fatigue       Relevant Orders   TSH       CBC w/ diff, CMP, Lipid panel, and HgbA1c were ordered today. Preceptor also ordered a TSH to check for abnormalities that  may be attributing to patient's fatigue. A1c is 6.0 today and patient instructed to continue with lifestyle changes (dietary control and physical activity) only.  Discussed the possibility of switching from Lisinopril to an ARB or ARNI to see if cough will reduce. Patient does not want to change her blood pressure medication. Patient asked for a refill of Tessalon pearls.  Patient will continue taking her Lexapro 10 mg for anxiety and depression. Preceptor is okay with patient taking 5 mg in the morning and 5 mg at night.  Follow up plan: Return in about 6 months (around 02/08/2023), or if symptoms worsen or fail to improve, for Prediabetes anxiety depression.  Counseling provided for all of the vaccine components Orders Placed This Encounter  Procedures   CBC with Differential/Platelet   CMP14+EGFR   Lipid panel   Bayer DCA Hb A1c Waived   TSH   Summer Dupont, PA-S2 [facility] 08/08/2022, 8:52 AM  I was personally present for all components of the history, physical exam and/or medical decision making.  I agree with the documentation performed by the PA student and agree with assessment and plan above.  PA student Vonna Kotyk Termaine Roupp [facility] 08/08/2022, 8:52 AM

## 2022-08-09 LAB — CBC WITH DIFFERENTIAL/PLATELET
Basophils Absolute: 0.1 10*3/uL (ref 0.0–0.2)
Basos: 1 %
EOS (ABSOLUTE): 0.2 10*3/uL (ref 0.0–0.4)
Eos: 3 %
Hematocrit: 34.8 % (ref 34.0–46.6)
Hemoglobin: 11.6 g/dL (ref 11.1–15.9)
Immature Grans (Abs): 0 10*3/uL (ref 0.0–0.1)
Immature Granulocytes: 0 %
Lymphocytes Absolute: 1.8 10*3/uL (ref 0.7–3.1)
Lymphs: 33 %
MCH: 31.4 pg (ref 26.6–33.0)
MCHC: 33.3 g/dL (ref 31.5–35.7)
MCV: 94 fL (ref 79–97)
Monocytes Absolute: 0.5 10*3/uL (ref 0.1–0.9)
Monocytes: 9 %
Neutrophils Absolute: 2.9 10*3/uL (ref 1.4–7.0)
Neutrophils: 54 %
Platelets: 219 10*3/uL (ref 150–450)
RBC: 3.7 x10E6/uL — ABNORMAL LOW (ref 3.77–5.28)
RDW: 11.9 % (ref 11.7–15.4)
WBC: 5.3 10*3/uL (ref 3.4–10.8)

## 2022-08-09 LAB — CMP14+EGFR
ALT: 11 IU/L (ref 0–32)
AST: 18 IU/L (ref 0–40)
Albumin/Globulin Ratio: 1.8 (ref 1.2–2.2)
Albumin: 4.2 g/dL (ref 3.8–4.8)
Alkaline Phosphatase: 96 IU/L (ref 44–121)
BUN/Creatinine Ratio: 23 (ref 12–28)
BUN: 34 mg/dL — ABNORMAL HIGH (ref 8–27)
Bilirubin Total: 0.4 mg/dL (ref 0.0–1.2)
CO2: 23 mmol/L (ref 20–29)
Calcium: 9.4 mg/dL (ref 8.7–10.3)
Chloride: 104 mmol/L (ref 96–106)
Creatinine, Ser: 1.51 mg/dL — ABNORMAL HIGH (ref 0.57–1.00)
Globulin, Total: 2.3 g/dL (ref 1.5–4.5)
Glucose: 100 mg/dL — ABNORMAL HIGH (ref 70–99)
Potassium: 4.5 mmol/L (ref 3.5–5.2)
Sodium: 141 mmol/L (ref 134–144)
Total Protein: 6.5 g/dL (ref 6.0–8.5)
eGFR: 36 mL/min/{1.73_m2} — ABNORMAL LOW (ref >59)

## 2022-08-09 LAB — LIPID PANEL
Chol/HDL Ratio: 2 ratio (ref 0.0–4.4)
Cholesterol, Total: 132 mg/dL (ref 100–199)
HDL: 67 mg/dL (ref >39)
LDL Chol Calc (NIH): 54 mg/dL (ref 0–99)
Triglycerides: 48 mg/dL (ref 0–149)
VLDL Cholesterol Cal: 11 mg/dL (ref 5–40)

## 2022-08-10 LAB — TSH: TSH: 2.08 u[IU]/mL (ref 0.450–4.500)

## 2022-08-10 LAB — SPECIMEN STATUS REPORT

## 2022-08-16 ENCOUNTER — Ambulatory Visit (INDEPENDENT_AMBULATORY_CARE_PROVIDER_SITE_OTHER): Payer: Medicare Other

## 2022-08-16 VITALS — Ht 63.0 in | Wt 120.0 lb

## 2022-08-16 DIAGNOSIS — Z0000 Encounter for general adult medical examination without abnormal findings: Secondary | ICD-10-CM
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
## 2022-08-16 NOTE — Patient Instructions (Signed)
Pam Franklin , Thank you for taking time to come for your Medicare Wellness Visit. I appreciate your ongoing commitment to your health goals. Please review the following plan we discussed and let me know if I can assist you in the future.   These are the goals we discussed:  Goals      DIET - INCREASE WATER INTAKE     Increase water to 48 to 64 oz per day     Exercise 3x per week (30 min per time)     Walking is a great option.      Patient Stated     08/08/2019 AWV Goal: Fall Prevention  Over the next year, patient will decrease their risk for falls by: Using assistive devices, such as a cane or walker, as needed Identifying fall risks within their home and correcting them by: Removing throw rugs Adding handrails to stairs or ramps Removing clutter and keeping a clear pathway throughout the home Increasing light, especially at night Adding shower handles/bars Raising toilet seat Identifying potential personal risk factors for falls: Medication side effects Incontinence/urgency Vestibular dysfunction Hearing loss Musculoskeletal disorders Neurological disorders Orthostatic hypotension          This is a list of the screening recommended for you and due dates:  Health Maintenance  Topic Date Due   DEXA scan (bone density measurement)  05/21/2023*   Mammogram  01/22/2023   DTaP/Tdap/Td vaccine (2 - Td or Tdap) 03/01/2023   Medicare Annual Wellness Visit  08/16/2023   Colon Cancer Screening  09/12/2029   Pneumonia Vaccine  Completed   Flu Shot  Completed   COVID-19 Vaccine  Completed   Hepatitis C Screening: USPSTF Recommendation to screen - Ages 18-79 yo.  Completed   Zoster (Shingles) Vaccine  Completed   HPV Vaccine  Aged Out  *Topic was postponed. The date shown is not the original due date.    Advanced directives: Advance directive discussed with you today. I have provided a copy for you to complete at home and have notarized. Once this is complete please bring  a copy in to our office so we can scan it into your chart.   Conditions/risks identified: Aim for 30 minutes of exercise or brisk walking, 6-8 glasses of water, and 5 servings of fruits and vegetables each day.   Next appointment: Follow up in one year for your annual wellness visit    Preventive Care 65 Years and Older, Female Preventive care refers to lifestyle choices and visits with your health care provider that can promote health and wellness. What does preventive care include? A yearly physical exam. This is also called an annual well check. Dental exams once or twice a year. Routine eye exams. Ask your health care provider how often you should have your eyes checked. Personal lifestyle choices, including: Daily care of your teeth and gums. Regular physical activity. Eating a healthy diet. Avoiding tobacco and drug use. Limiting alcohol use. Practicing safe sex. Taking low-dose aspirin every day. Taking vitamin and mineral supplements as recommended by your health care provider. What happens during an annual well check? The services and screenings done by your health care provider during your annual well check will depend on your age, overall health, lifestyle risk factors, and family history of disease. Counseling  Your health care provider may ask you questions about your: Alcohol use. Tobacco use. Drug use. Emotional well-being. Home and relationship well-being. Sexual activity. Eating habits. History of falls. Memory and ability to understand (  cognition). Work and work Statistician. Reproductive health. Screening  You may have the following tests or measurements: Height, weight, and BMI. Blood pressure. Lipid and cholesterol levels. These may be checked every 5 years, or more frequently if you are over 71 years old. Skin check. Lung cancer screening. You may have this screening every year starting at age 41 if you have a 30-pack-year history of smoking and  currently smoke or have quit within the past 15 years. Fecal occult blood test (FOBT) of the stool. You may have this test every year starting at age 23. Flexible sigmoidoscopy or colonoscopy. You may have a sigmoidoscopy every 5 years or a colonoscopy every 10 years starting at age 58. Hepatitis C blood test. Hepatitis B blood test. Sexually transmitted disease (STD) testing. Diabetes screening. This is done by checking your blood sugar (glucose) after you have not eaten for a while (fasting). You may have this done every 1-3 years. Bone density scan. This is done to screen for osteoporosis. You may have this done starting at age 83. Mammogram. This may be done every 1-2 years. Talk to your health care provider about how often you should have regular mammograms. Talk with your health care provider about your test results, treatment options, and if necessary, the need for more tests. Vaccines  Your health care provider may recommend certain vaccines, such as: Influenza vaccine. This is recommended every year. Tetanus, diphtheria, and acellular pertussis (Tdap, Td) vaccine. You may need a Td booster every 10 years. Zoster vaccine. You may need this after age 28. Pneumococcal 13-valent conjugate (PCV13) vaccine. One dose is recommended after age 10. Pneumococcal polysaccharide (PPSV23) vaccine. One dose is recommended after age 16. Talk to your health care provider about which screenings and vaccines you need and how often you need them. This information is not intended to replace advice given to you by your health care provider. Make sure you discuss any questions you have with your health care provider. Document Released: 06/12/2015 Document Revised: 02/03/2016 Document Reviewed: 03/17/2015 Elsevier Interactive Patient Education  2017 [employer] Prevention in the Home Falls can cause injuries. They can happen to people of all ages. There are many things you can do to make your home  safe and to help prevent falls. What can I do on the outside of my home? Regularly fix the edges of walkways and driveways and fix any cracks. Remove anything that might make you trip as you walk through a door, such as a raised step or threshold. Trim any bushes or trees on the path to your home. Use bright outdoor lighting. Clear any walking paths of anything that might make someone trip, such as rocks or tools. Regularly check to see if handrails are loose or broken. Make sure that both sides of any steps have handrails. Any raised decks and porches should have guardrails on the edges. Have any leaves, snow, or ice cleared regularly. Use sand or salt on walking paths during winter. Clean up any spills in your garage right away. This includes oil or grease spills. What can I do in the bathroom? Use night lights. Install grab bars by the toilet and in the tub and shower. Do not use towel bars as grab bars. Use non-skid mats or decals in the tub or shower. If you need to sit down in the shower, use a plastic, non-slip stool. Keep the floor dry. Clean up any water that spills on the floor as soon as it happens.  Remove soap buildup in the tub or shower regularly. Attach bath mats securely with double-sided non-slip rug tape. Do not have throw rugs and other things on the floor that can make you trip. What can I do in the bedroom? Use night lights. Make sure that you have a light by your bed that is easy to reach. Do not use any sheets or blankets that are too big for your bed. They should not hang down onto the floor. Have a firm chair that has side arms. You can use this for support while you get dressed. Do not have throw rugs and other things on the floor that can make you trip. What can I do in the kitchen? Clean up any spills right away. Avoid walking on wet floors. Keep items that you use a lot in easy-to-reach places. If you need to reach something above you, use a strong step  stool that has a grab bar. Keep electrical cords out of the way. Do not use floor polish or wax that makes floors slippery. If you must use wax, use non-skid floor wax. Do not have throw rugs and other things on the floor that can make you trip. What can I do with my stairs? Do not leave any items on the stairs. Make sure that there are handrails on both sides of the stairs and use them. Fix handrails that are broken or loose. Make sure that handrails are as long as the stairways. Check any carpeting to make sure that it is firmly attached to the stairs. Fix any carpet that is loose or worn. Avoid having throw rugs at the top or bottom of the stairs. If you do have throw rugs, attach them to the floor with carpet tape. Make sure that you have a light switch at the top of the stairs and the bottom of the stairs. If you do not have them, ask someone to add them for you. What else can I do to help prevent falls? Wear shoes that: Do not have high heels. Have rubber bottoms. Are comfortable and fit you well. Are closed at the toe. Do not wear sandals. If you use a stepladder: Make sure that it is fully opened. Do not climb a closed stepladder. Make sure that both sides of the stepladder are locked into place. Ask someone to hold it for you, if possible. Clearly mark and make sure that you can see: Any grab bars or handrails. First and last steps. Where the edge of each step is. Use tools that help you move around (mobility aids) if they are needed. These include: Canes. Walkers. Scooters. Crutches. Turn on the lights when you go into a dark area. Replace any light bulbs as soon as they burn out. Set up your furniture so you have a clear path. Avoid moving your furniture around. If any of your floors are uneven, fix them. If there are any pets around you, be aware of where they are. Review your medicines with your doctor. Some medicines can make you feel dizzy. This can increase your  chance of falling. Ask your doctor what other things that you can do to help prevent falls. This information is not intended to replace advice given to you by your health care provider. Make sure you discuss any questions you have with your health care provider. Document Released: 03/12/2009 Document Revised: 10/22/2015 Document Reviewed: 06/20/2014 Elsevier Interactive Patient Education  2017 [employer].

## 2022-10-07 ENCOUNTER — Ambulatory Visit: Payer: Medicare Other | Admitting: Family Medicine

## 2022-11-03 DIAGNOSIS — D225 Melanocytic nevi of trunk: Secondary | ICD-10-CM | POA: Diagnosis not present

## 2022-11-03 DIAGNOSIS — Z85828 Personal history of other malignant neoplasm of skin: Secondary | ICD-10-CM | POA: Diagnosis not present

## 2022-11-03 DIAGNOSIS — L718 Other rosacea: Secondary | ICD-10-CM | POA: Diagnosis not present

## 2022-11-03 DIAGNOSIS — L821 Other seborrheic keratosis: Secondary | ICD-10-CM | POA: Diagnosis not present

## 2022-11-03 DIAGNOSIS — L820 Inflamed seborrheic keratosis: Secondary | ICD-10-CM | POA: Diagnosis not present

## 2022-11-03 DIAGNOSIS — L814 Other melanin hyperpigmentation: Secondary | ICD-10-CM | POA: Diagnosis not present

## 2022-11-03 DIAGNOSIS — L570 Actinic keratosis: Secondary | ICD-10-CM | POA: Diagnosis not present

## 2022-11-13 ENCOUNTER — Other Ambulatory Visit: Payer: Self-pay | Admitting: Family Medicine

## 2022-11-13 DIAGNOSIS — M85859 Other specified disorders of bone density and structure, unspecified thigh: Secondary | ICD-10-CM

## 2022-12-12 DIAGNOSIS — L570 Actinic keratosis: Secondary | ICD-10-CM | POA: Diagnosis not present

## 2022-12-12 DIAGNOSIS — L820 Inflamed seborrheic keratosis: Secondary | ICD-10-CM | POA: Diagnosis not present

## 2022-12-20 ENCOUNTER — Encounter: Payer: Self-pay | Admitting: Gastroenterology

## 2022-12-20 ENCOUNTER — Ambulatory Visit (INDEPENDENT_AMBULATORY_CARE_PROVIDER_SITE_OTHER): Payer: Medicare Other | Admitting: Gastroenterology

## 2022-12-20 VITALS — BP 138/68 | HR 60 | Ht 63.0 in | Wt 118.4 lb

## 2022-12-20 DIAGNOSIS — K5904 Chronic idiopathic constipation: Secondary | ICD-10-CM

## 2022-12-20 DIAGNOSIS — R079 Chest pain, unspecified: Secondary | ICD-10-CM | POA: Diagnosis not present

## 2022-12-20 DIAGNOSIS — R194 Change in bowel habit: Secondary | ICD-10-CM

## 2022-12-20 NOTE — Patient Instructions (Signed)
Start over the counter Miralax daily.   Call our office if your constipation has not improved.   Also, contact our office if your chest pain starts happening around meals or becomes more GI related.   _______________________________________________________  If your blood pressure at your visit was 140/90 or greater, please contact your primary care physician to follow up on this.  _______________________________________________________  If you are age 74 or older, your body mass index should be between 23-30. Your Body mass index is 20.97 kg/m. If this is out of the aforementioned range listed, please consider follow up with your Primary Care Provider.  If you are age 42 or younger, your body mass index should be between 19-25. Your Body mass index is 20.97 kg/m. If this is out of the aformentioned range listed, please consider follow up with your Primary Care Provider.   ________________________________________________________  The [facility] providers would like to encourage you to use [facility] to communicate with providers for non-urgent requests or questions.  Due to long hold times on the telephone, sending your provider a message by [facility] may be a faster and more efficient way to get a response.  Please allow 48 business hours for a response.  Please remember that this is for non-urgent requests.  _______________________________________________________  Thank you for choosing me and [facility].  Venita Lick. Pleas Koch., MD., Clementeen Graham
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
## 2023-02-03 DIAGNOSIS — Z1231 Encounter for screening mammogram for malignant neoplasm of breast: Secondary | ICD-10-CM | POA: Diagnosis not present

## 2023-02-08 ENCOUNTER — Encounter: Payer: Self-pay | Admitting: Family Medicine

## 2023-02-08 ENCOUNTER — Ambulatory Visit (INDEPENDENT_AMBULATORY_CARE_PROVIDER_SITE_OTHER): Payer: Medicare Other | Admitting: Family Medicine

## 2023-02-08 VITALS — BP 152/62 | HR 51 | Ht 63.0 in | Wt 120.0 lb

## 2023-02-08 DIAGNOSIS — Z23 Encounter for immunization: Secondary | ICD-10-CM

## 2023-02-08 DIAGNOSIS — R7303 Prediabetes: Secondary | ICD-10-CM | POA: Diagnosis not present

## 2023-02-08 DIAGNOSIS — F32A Depression, unspecified: Secondary | ICD-10-CM

## 2023-02-08 DIAGNOSIS — K219 Gastro-esophageal reflux disease without esophagitis: Secondary | ICD-10-CM

## 2023-02-08 DIAGNOSIS — I10 Essential (primary) hypertension: Secondary | ICD-10-CM | POA: Diagnosis not present

## 2023-02-08 DIAGNOSIS — E7800 Pure hypercholesterolemia, unspecified: Secondary | ICD-10-CM

## 2023-02-08 DIAGNOSIS — F419 Anxiety disorder, unspecified: Secondary | ICD-10-CM

## 2023-02-08 LAB — BAYER DCA HB A1C WAIVED: HB A1C (BAYER DCA - WAIVED): 5.9 % — ABNORMAL HIGH (ref 4.8–5.6)

## 2023-02-08 MED ORDER — ESCITALOPRAM OXALATE 20 MG PO TABS
20.0000 mg | ORAL_TABLET | Freq: Every day | ORAL | 3 refills | Status: DC
Start: 2023-02-08 — End: 2024-01-24

## 2023-02-08 NOTE — Progress Notes (Signed)
BP (!) 152/62   Pulse (!) 51   Ht 5\' 3"  (1.6 m)   Wt 120 lb (54.4 kg)   LMP  (LMP Unknown)   SpO2 99%   BMI 21.26 kg/m    Subjective:   Patient ID: Pam Franklin, female    DOB: 11/29/1950, 72 y.o.   MRN: [id number]  HPI: Pam Franklin is a 72 y.o. female presenting on 02/08/2023 for Medical Management of Chronic Issues, Prediabetes, Hypertension, and Hyperlipidemia   HPI Hypertension Patient is currently on hydrochlorothiazide and lisinopril, and their blood pressure today is 152/62. Patient denies any lightheadedness or dizziness. Patient denies headaches, blurred vision, chest pains, shortness of breath, or weakness. Denies any side effects from medication and is content with current medication.   Hyperlipidemia Patient is coming in for recheck of his hyperlipidemia. The patient is currently taking atorvastatin. They deny any issues with myalgias or history of liver damage from it. They deny any focal numbness or weakness or chest pain.   Prediabetes Patient comes in today for recheck of his diabetes. Patient has been currently taking no medication currently. Patient is currently on an ACE inhibitor/ARB. Patient has not seen an ophthalmologist this year. Patient denies any new issues with their feet. The symptom started onset as an adult hypertension and hyperlipidemia ARE RELATED TO DM   Patient has been having some indigestion and fluttering in her stomach area and some heartburn has been increased over the past few weeks.  She did just over the past week start taking some Pepcid along with her Tums.  Anxiety and depression Patient is coming in for anxiety depression recheck.  She is currently taking Lexapro 10 mg daily.  She feels like she has been more anxious in the afternoons and the specific medicines were not not lasting her very long.  Patient denies any suicidal ideations or major feelings of depression.    02/08/2023    8:09 AM 02/08/2023    8:08 AM 08/16/2022    9:17  AM 08/08/2022    8:07 AM 05/06/2022    3:31 PM  Depression screen PHQ 2/9  Decreased Interest  0 0 1   Down, Depressed, Hopeless  0 0 1   PHQ - 2 Score  0 0 2   Altered sleeping 0  0 1   Tired, decreased energy 2  0 3   Change in appetite 0  0 1   Feeling bad or failure about yourself  0  0 1   Trouble concentrating 0  0 0   Moving slowly or fidgety/restless 0  0 0   Suicidal thoughts 0  0 0   PHQ-9 Score   0 8   Difficult doing work/chores Somewhat difficult  Not difficult at all Somewhat difficult Somewhat difficult     Relevant past medical, surgical, family and social history reviewed and updated as indicated. Interim medical history since our last visit reviewed. Allergies and medications reviewed and updated.  Review of Systems  Constitutional:  Negative for chills and fever.  Eyes:  Negative for visual disturbance.  Respiratory:  Negative for chest tightness and shortness of breath.   Cardiovascular:  Negative for chest pain and leg swelling.  Genitourinary:  Negative for difficulty urinating and dysuria.  Musculoskeletal:  Negative for back pain and gait problem.  Skin:  Negative for rash.  Neurological:  Negative for dizziness, light-headedness and headaches.  Psychiatric/Behavioral:  Negative for agitation and behavioral problems. The patient is nervous/anxious.  All other systems reviewed and are negative.   Per HPI unless specifically indicated above   Allergies as of 02/08/2023       Reactions   Codeine Other (See Comments)   Makes her irritable   Nitrofurantoin Nausea And Vomiting   REACTION: nausea and vomiting   Prednisone Nausea Only        Medication List        Accurate as of February 08, 2023  8:33 AM. If you have any questions, ask your nurse or doctor.          aspirin 81 MG tablet Take 81 mg by mouth daily.   atorvastatin 20 MG tablet Commonly known as: LIPITOR Take 1 tablet (20 mg total) by mouth daily.   azelaic acid 20 %  cream Commonly known as: AZELEX Apply topically 2 (two) times daily. After skin is thoroughly washed and patted dry, gently but thoroughly massage a thin film of azelaic acid cream into the affected area twice daily, in the morning and evening.   Biotin 5 MG Caps Take 1 capsule by mouth daily.   Cetirizine HCl 10 MG Caps Take 10 mg by mouth daily.   Cinnamon 500 MG capsule Take 500 mg by mouth daily.   cycloSPORINE 0.05 % ophthalmic emulsion Commonly known as: RESTASIS 1 drop 2 (two) times daily.   escitalopram 20 MG tablet Commonly known as: LEXAPRO Take 1 tablet (20 mg total) by mouth daily. What changed:  medication strength how much to take Changed by: Elige Radon Jakaleb Payer   estradiol 0.025 MG/24HR Commonly known as: VIVELLE-DOT Place 1 patch onto the skin 2 (two) times a week. What changed: Another medication with the same name was removed. Continue taking this medication, and follow the directions you see here. Changed by: Elige Radon Kamil Mchaffie   fluticasone 50 MCG/ACT nasal spray Commonly known as: FLONASE Place 1 spray into both nostrils 2 (two) times daily as needed for allergies or rhinitis.   hydrochlorothiazide 25 MG tablet Commonly known as: HYDRODIURIL Take 1 tablet (25 mg total) by mouth daily.   L-Lysine 1000 MG Tabs Take 1 tablet by mouth daily.   lisinopril 30 MG tablet Commonly known as: ZESTRIL Take 1 tablet (30 mg total) by mouth daily.   Magnesium 250 MG Tabs Take 1 tablet by mouth daily.   Oyster Shell Calcium w/D 500-5 MG-MCG Tabs TAKE 1 TABLET BY MOUTH EVERY DAY WITH BREAKFAST         Objective:   BP (!) 152/62   Pulse (!) 51   Ht 5\' 3"  (1.6 m)   Wt 120 lb (54.4 kg)   LMP  (LMP Unknown)   SpO2 99%   BMI 21.26 kg/m   Wt Readings from Last 3 Encounters:  02/08/23 120 lb (54.4 kg)  12/20/22 118 lb 6.4 oz (53.7 kg)  08/16/22 120 lb (54.4 kg)    Physical Exam Vitals and nursing note reviewed.  Constitutional:      General: She  is not in acute distress.    Appearance: She is well-developed. She is not diaphoretic.  Eyes:     Conjunctiva/sclera: Conjunctivae normal.  Cardiovascular:     Rate and Rhythm: Normal rate and regular rhythm.     Heart sounds: Normal heart sounds. No murmur heard. Pulmonary:     Effort: Pulmonary effort is normal. No respiratory distress.     Breath sounds: Normal breath sounds. No wheezing.  Musculoskeletal:        General: No swelling  or tenderness. Normal range of motion.  Skin:    General: Skin is warm and dry.     Findings: No rash.  Neurological:     Mental Status: She is alert and oriented to person, place, and time.     Coordination: Coordination normal.  Psychiatric:        Mood and Affect: Mood is anxious.        Behavior: Behavior normal.        Thought Content: Thought content does not include suicidal ideation. Thought content does not include suicidal plan.       Assessment & Plan:   Problem List Items Addressed This Visit       Cardiovascular and Mediastinum   Essential hypertension, benign - Primary   Relevant Orders   CMP14+EGFR   Lipid panel     Other   HLD (hyperlipidemia)   Relevant Orders   Lipid panel   Prediabetes   Relevant Orders   Bayer DCA Hb A1c Waived   Anxiety and depression   Relevant Medications   escitalopram (LEXAPRO) 20 MG tablet   Other Relevant Orders   CBC with Differential/Platelet   TSH   Other Visit Diagnoses     Gastroesophageal reflux disease without esophagitis           Will increase her dose of Lexapro.  She says her blood pressure is running normally good at home, will have her keep a close eye in the next few weeks and if running elevated over the 140s she will let us know.  Possibly due to stress as well.  Patient is having some increased GERD and has started trying just within the past week doing some Pepcid along with her Tums.  She says it is helping some so she will continue that for now but if not  doing well in the future we may start her on a PPI Follow up plan: Return in about 6 months (around 08/08/2023), or if symptoms worsen or fail to improve, for Anxiety and hypertension and hyperlipidemia and prediabetes.  Counseling provided for all of the vaccine components Orders Placed This Encounter  Procedures   CBC with Differential/Platelet   CMP14+EGFR   Lipid panel   Bayer DCA Hb A1c Waived   TSH    Arville Care, MD [facility] 02/08/2023, 8:33 AM

## 2023-02-09 LAB — CMP14+EGFR
ALT: 14 IU/L (ref 0–32)
AST: 20 IU/L (ref 0–40)
Albumin: 4.3 g/dL (ref 3.8–4.8)
Alkaline Phosphatase: 101 IU/L (ref 44–121)
BUN/Creatinine Ratio: 18 (ref 12–28)
BUN: 25 mg/dL (ref 8–27)
Bilirubin Total: 0.6 mg/dL (ref 0.0–1.2)
CO2: 25 mmol/L (ref 20–29)
Calcium: 10 mg/dL (ref 8.7–10.3)
Chloride: 102 mmol/L (ref 96–106)
Creatinine, Ser: 1.36 mg/dL — ABNORMAL HIGH (ref 0.57–1.00)
Globulin, Total: 2 g/dL (ref 1.5–4.5)
Glucose: 106 mg/dL — ABNORMAL HIGH (ref 70–99)
Potassium: 4.4 mmol/L (ref 3.5–5.2)
Sodium: 143 mmol/L (ref 134–144)
Total Protein: 6.3 g/dL (ref 6.0–8.5)
eGFR: 41 mL/min/{1.73_m2} — ABNORMAL LOW (ref >59)

## 2023-02-09 LAB — CBC WITH DIFFERENTIAL/PLATELET
Basophils Absolute: 0.1 10*3/uL (ref 0.0–0.2)
Basos: 1 %
EOS (ABSOLUTE): 0.1 10*3/uL (ref 0.0–0.4)
Eos: 2 %
Hematocrit: 34.7 % (ref 34.0–46.6)
Hemoglobin: 11.6 g/dL (ref 11.1–15.9)
Immature Grans (Abs): 0 10*3/uL (ref 0.0–0.1)
Immature Granulocytes: 0 %
Lymphocytes Absolute: 1.5 10*3/uL (ref 0.7–3.1)
Lymphs: 29 %
MCH: 31.6 pg (ref 26.6–33.0)
MCHC: 33.4 g/dL (ref 31.5–35.7)
MCV: 95 fL (ref 79–97)
Monocytes Absolute: 0.5 10*3/uL (ref 0.1–0.9)
Monocytes: 10 %
Neutrophils Absolute: 3.1 10*3/uL (ref 1.4–7.0)
Neutrophils: 58 %
Platelets: 242 10*3/uL (ref 150–450)
RBC: 3.67 x10E6/uL — ABNORMAL LOW (ref 3.77–5.28)
RDW: 11.8 % (ref 11.7–15.4)
WBC: 5.3 10*3/uL (ref 3.4–10.8)

## 2023-02-09 LAB — LIPID PANEL
Chol/HDL Ratio: 1.9 ratio (ref 0.0–4.4)
Cholesterol, Total: 134 mg/dL (ref 100–199)
HDL: 69 mg/dL (ref >39)
LDL Chol Calc (NIH): 54 mg/dL (ref 0–99)
Triglycerides: 51 mg/dL (ref 0–149)
VLDL Cholesterol Cal: 11 mg/dL (ref 5–40)

## 2023-02-09 LAB — TSH: TSH: 2.04 u[IU]/mL (ref 0.450–4.500)

## 2023-02-15 ENCOUNTER — Ambulatory Visit: Payer: Medicare Other | Attending: Cardiology | Admitting: Cardiology

## 2023-02-15 ENCOUNTER — Encounter: Payer: Self-pay | Admitting: Cardiology

## 2023-02-15 VITALS — BP 144/58 | HR 64 | Ht 63.0 in | Wt 122.0 lb

## 2023-02-15 DIAGNOSIS — E7800 Pure hypercholesterolemia, unspecified: Secondary | ICD-10-CM | POA: Insufficient documentation

## 2023-02-15 DIAGNOSIS — I10 Essential (primary) hypertension: Secondary | ICD-10-CM | POA: Diagnosis not present

## 2023-02-15 DIAGNOSIS — I340 Nonrheumatic mitral (valve) insufficiency: Secondary | ICD-10-CM | POA: Diagnosis not present

## 2023-02-15 NOTE — Progress Notes (Signed)
Cardiology Office Note:  .   Date:  02/15/2023  ID:  Pam Franklin, DOB Jan 29, 1951, MRN [id number] PCP: Dettinger, Elige Radon, MD  [facility] Providers Cardiologist:  Donato Schultz, MD    History of Present Illness: .   Pam Franklin is a 72 y.o. female follow up MR. Discussed the use of AI scribe software  History of Present Illness   [name], a patient with a history of mitral valve regurgitation and a family history of coronary disease, presents with fatigue and occasional chest pain. She reports feeling tired and experiences chest pain only when she exerts herself. She describes the chest pain as a "little twinge" and also experiences a fluttering sensation in her stomach. She denies any recent fainting episodes.  She has been on atorvastatin 20 mg for cholesterol management, lisinopril 30 mg, and hydrochlorothiazide 25 mg for blood pressure control. Her recent labs show an LDL of 54 and Hemoglobin A1c of 5.9, indicating good control of her cholesterol and no presence of diabetes. Her creatinine is 1.3.  She also mentions that her mother had a valve replacement and her brother had bypass surgery. Her husband has a pacemaker and she helps him manage his health conditions, including Parkinson's disease and PTSD.         Studies Reviewed: Marland Kitchen   EKG Interpretation Date/Time:  Wednesday February 15 2023 15:49:46 EDT Ventricular Rate:  61 PR Interval:  234 QRS Duration:  88 QT Interval:  424 QTC Calculation: 426 R Axis:   47  Text Interpretation: Sinus rhythm with 1st degree A-V block Non-specific ST-t changes No previous ECGs available Confirmed by Donato Schultz ([id number]) on 02/15/2023 4:20:34 PM    LABS LDL: 54 (02/08/2023) Hemoglobin A1c: 5.9 (02/08/2023) Creatinine: 1.3 (02/08/2023)  DIAGNOSTIC Echocardiogram: Mild eccentric mitral valve regurgitation (2020) Risk Assessment/Calculations:           Physical Exam:   VS:  BP (!) 144/58   Pulse 64   Ht 5\' 3"  (1.6 m)    Wt 122 lb (55.3 kg)   LMP  (LMP Unknown)   SpO2 98%   BMI 21.61 kg/m    Wt Readings from Last 3 Encounters:  02/15/23 122 lb (55.3 kg)  02/08/23 120 lb (54.4 kg)  12/20/22 118 lb 6.4 oz (53.7 kg)    GEN: Well nourished, well developed in no acute distress NECK: No JVD; No carotid bruits CARDIAC: RRR, soft systolic murmur, no rubs, gallops RESPIRATORY:  Clear to auscultation without rales, wheezing or rhonchi  ABDOMEN: Soft, non-tender, non-distended EXTREMITIES:  No edema; No deformity   ASSESSMENT AND PLAN: .    Assessment and Plan    Mitral Valve Regurgitation Mild regurgitation noted on echocardiogram in 2020. No recent symptoms of heart failure or syncope. Occasional chest discomfort and palpitations. Family history of valve disease and coronary artery disease. -Order repeat echocardiogram to reassess mitral valve regurgitation.  Hypertension Blood pressure slightly elevated today, but patient is on Lisinopril 30mg  daily and Hydrochlorothiazide 25mg  daily. -Advise patient to monitor blood pressure at home.  Hyperlipidemia LDL at goal on Atorvastatin 20mg  daily (LDL 54 on 02/08/23). -Continue Atorvastatin 20mg  daily.  General Health Maintenance / Followup Plans -Follow up in 2 years unless changes in symptoms occur.             Signed, Donato Schultz, MD

## 2023-02-15 NOTE — Patient Instructions (Signed)
Medication Instructions:  The current medical regimen is effective;  continue present plan and medications.  *If you need a refill on your cardiac medications before your next appointment, please call your pharmacy*  Testing/Procedures: Your physician has requested that you have an echocardiogram. Echocardiography is a painless test that uses sound waves to create images of your heart. It provides your doctor with information about the size and shape of your heart and how well your heart's chambers and valves are working. This procedure takes approximately one hour. There are no restrictions for this procedure. Please do NOT wear cologne, perfume, aftershave, or lotions (deodorant is allowed). Please arrive 15 minutes prior to your appointment time.   Follow-Up: At [facility], you and your health needs are our priority.  As part of our continuing mission to provide you with exceptional heart care, we have created designated Provider Care Teams.  These Care Teams include your primary Cardiologist (physician) and Advanced Practice Providers (APPs -  Physician Assistants and Nurse Practitioners) who all work together to provide you with the care you need, when you need it.  We recommend signing up for the patient portal called "MyChart".  Sign up information is provided on this After Visit Summary.  MyChart is used to connect with patients for Virtual Visits (Telemedicine).  Patients are able to view lab/test results, encounter notes, upcoming appointments, etc.  Non-urgent messages can be sent to your provider as well.   To learn more about what you can do with MyChart, go to [email or web address].    Your next appointment:   2 year(s)  Provider:   Donato Schultz, MD

## 2023-02-16 ENCOUNTER — Encounter: Payer: Self-pay | Admitting: Family Medicine

## 2023-03-09 ENCOUNTER — Ambulatory Visit (HOSPITAL_COMMUNITY): Payer: Medicare Other | Attending: Cardiology

## 2023-03-09 DIAGNOSIS — I10 Essential (primary) hypertension: Secondary | ICD-10-CM | POA: Diagnosis not present

## 2023-03-09 LAB — ECHOCARDIOGRAM COMPLETE
Area-P 1/2: 2.99 cm2
S' Lateral: 2.6 cm

## 2023-03-13 DIAGNOSIS — L570 Actinic keratosis: Secondary | ICD-10-CM | POA: Diagnosis not present

## 2023-03-13 DIAGNOSIS — L720 Epidermal cyst: Secondary | ICD-10-CM | POA: Diagnosis not present

## 2023-08-03 ENCOUNTER — Other Ambulatory Visit: Payer: Self-pay | Admitting: Family Medicine

## 2023-08-03 DIAGNOSIS — E7800 Pure hypercholesterolemia, unspecified: Secondary | ICD-10-CM

## 2023-08-03 DIAGNOSIS — I10 Essential (primary) hypertension: Secondary | ICD-10-CM

## 2023-08-09 ENCOUNTER — Encounter: Payer: Self-pay | Admitting: Family Medicine

## 2023-08-09 ENCOUNTER — Ambulatory Visit: Payer: Medicare Other | Admitting: Family Medicine

## 2023-08-09 VITALS — BP 144/71 | HR 60 | Ht 63.0 in | Wt 121.0 lb

## 2023-08-09 DIAGNOSIS — R7303 Prediabetes: Secondary | ICD-10-CM | POA: Diagnosis not present

## 2023-08-09 DIAGNOSIS — I10 Essential (primary) hypertension: Secondary | ICD-10-CM

## 2023-08-09 DIAGNOSIS — E7800 Pure hypercholesterolemia, unspecified: Secondary | ICD-10-CM | POA: Diagnosis not present

## 2023-08-09 LAB — BAYER DCA HB A1C WAIVED: HB A1C (BAYER DCA - WAIVED): 6 % — ABNORMAL HIGH (ref 4.8–5.6)

## 2023-08-09 LAB — LIPID PANEL

## 2023-08-09 MED ORDER — ATORVASTATIN CALCIUM 20 MG PO TABS: 20.0000 mg | ORAL_TABLET | Freq: Every day | ORAL | 3 refills | Status: AC

## 2023-08-09 MED ORDER — LISINOPRIL 30 MG PO TABS
30.0000 mg | ORAL_TABLET | Freq: Every day | ORAL | 3 refills | Status: DC
Start: 2023-08-09 — End: 2024-03-25

## 2023-08-09 MED ORDER — HYDROCHLOROTHIAZIDE 25 MG PO TABS: 25.0000 mg | ORAL_TABLET | Freq: Every day | ORAL | 3 refills | Status: AC

## 2023-08-09 NOTE — Progress Notes (Signed)
 BP (!) 144/71   Pulse 60   Ht 5\' 3"  (1.6 m)   Wt 121 lb (54.9 kg)   LMP  (LMP Unknown)   SpO2 96%   BMI 21.43 kg/m    Subjective:   Patient ID: Pam Franklin, female    DOB: [birth year], 73 y.o.   MRN: [id number]  HPI: Pam Franklin is a 73 y.o. female presenting on 08/09/2023 for Medical Management of Chronic Issues, Hypertension, Hyperlipidemia, and Prediabetes   HPI Hypertension Patient is currently on lisinopril-hydrochlorothiazide, and their blood pressure today is 144/71. Patient denies any lightheadedness or dizziness. Patient denies headaches, blurred vision, chest pains, shortness of breath, or weakness. Denies any side effects from medication and is content with current medication.   Hyperlipidemia Patient is coming in for recheck of his hyperlipidemia. The patient is currently taking atorvastatin. They deny any issues with myalgias or history of liver damage from it. They deny any focal numbness or weakness or chest pain.   Prediabetes Patient comes in today for recheck of his diabetes. Patient has been currently taking no medication, diet control. Patient is currently on an ACE inhibitor/ARB. Patient has not seen an ophthalmologist this year. Patient denies any new issues with their feet. The symptom started onset as an adult hypertension and hyperlipidemia ARE RELATED TO DM   Patient's biggest complaint today is fatigue that she has been getting.  She says she will sleep at night and she wakes up not feeling refreshed if she sits down in the afternoon she falls asleep quickly.  We discussed possibility of sleep apnea and doing testing for it and she is going to consider it.  Relevant past medical, surgical, family and social history reviewed and updated as indicated. Interim medical history since our last visit reviewed. Allergies and medications reviewed and updated.  Review of Systems  Constitutional:  Negative for chills and fever.  Eyes:  Negative for visual  disturbance.  Respiratory:  Negative for chest tightness and shortness of breath.   Cardiovascular:  Negative for chest pain and leg swelling.  Genitourinary:  Negative for difficulty urinating and dysuria.  Musculoskeletal:  Negative for back pain and gait problem.  Skin:  Negative for rash.  Neurological:  Negative for dizziness, light-headedness and headaches.  Psychiatric/Behavioral:  Negative for agitation and behavioral problems.   All other systems reviewed and are negative.   Per HPI unless specifically indicated above   Allergies as of 08/09/2023       Reactions   Codeine Other (See Comments)   Makes her irritable   Nitrofurantoin Nausea And Vomiting   REACTION: nausea and vomiting   Prednisone Nausea Only        Medication List        Accurate as of August 09, 2023  8:32 AM. If you have any questions, ask your nurse or doctor.          aspirin 81 MG tablet Take 81 mg by mouth daily.   atorvastatin 20 MG tablet Commonly known as: LIPITOR Take 1 tablet (20 mg total) by mouth daily.   azelaic acid 20 % cream Commonly known as: AZELEX Apply topically 2 (two) times daily. After skin is thoroughly washed and patted dry, gently but thoroughly massage a thin film of azelaic acid cream into the affected area twice daily, in the morning and evening.   Biotin 5 MG Caps Take 1 capsule by mouth daily.   Cetirizine HCl 10 MG Caps Take 10 mg by  mouth daily.   Cinnamon 500 MG capsule Take 500 mg by mouth daily.   cycloSPORINE 0.05 % ophthalmic emulsion Commonly known as: RESTASIS 1 drop 2 (two) times daily.   escitalopram 20 MG tablet Commonly known as: LEXAPRO Take 1 tablet (20 mg total) by mouth daily.   estradiol 0.025 MG/24HR Commonly known as: VIVELLE-DOT Place 1 patch onto the skin 2 (two) times a week.   fluticasone 50 MCG/ACT nasal spray Commonly known as: FLONASE Place 1 spray into both nostrils 2 (two) times daily as needed for allergies or  rhinitis.   hydrochlorothiazide 25 MG tablet Commonly known as: HYDRODIURIL Take 1 tablet (25 mg total) by mouth daily.   L-Lysine 1000 MG Tabs Take 1 tablet by mouth daily.   lisinopril 30 MG tablet Commonly known as: ZESTRIL Take 1 tablet (30 mg total) by mouth daily.   Magnesium 250 MG Tabs Take 1 tablet by mouth daily.   Oyster Shell Calcium w/D 500-5 MG-MCG Tabs TAKE 1 TABLET BY MOUTH EVERY DAY WITH BREAKFAST         Objective:   BP (!) 144/71   Pulse 60   Ht 5\' 3"  (1.6 m)   Wt 121 lb (54.9 kg)   LMP  (LMP Unknown)   SpO2 96%   BMI 21.43 kg/m   Wt Readings from Last 3 Encounters:  08/09/23 121 lb (54.9 kg)  02/15/23 122 lb (55.3 kg)  02/08/23 120 lb (54.4 kg)    Physical Exam Vitals and nursing note reviewed.  Constitutional:      General: She is not in acute distress.    Appearance: She is well-developed. She is not diaphoretic.  Eyes:     Conjunctiva/sclera: Conjunctivae normal.  Cardiovascular:     Rate and Rhythm: Normal rate and regular rhythm.     Heart sounds: Normal heart sounds. No murmur heard. Pulmonary:     Effort: Pulmonary effort is normal. No respiratory distress.     Breath sounds: Normal breath sounds. No wheezing.  Musculoskeletal:        General: No swelling. Normal range of motion.  Skin:    General: Skin is warm and dry.     Findings: No rash.  Neurological:     Mental Status: She is alert and oriented to person, place, and time.     Coordination: Coordination normal.  Psychiatric:        Behavior: Behavior normal.       Assessment & Plan:   Problem List Items Addressed This Visit       Cardiovascular and Mediastinum   Essential hypertension, benign - Primary   Relevant Medications   atorvastatin (LIPITOR) 20 MG tablet   hydrochlorothiazide (HYDRODIURIL) 25 MG tablet   lisinopril (ZESTRIL) 30 MG tablet   Other Relevant Orders   CBC with Differential/Platelet   CMP14+EGFR   Lipid panel   TSH   Bayer DCA Hb A1c  Waived     Other   HLD (hyperlipidemia)   Relevant Medications   atorvastatin (LIPITOR) 20 MG tablet   hydrochlorothiazide (HYDRODIURIL) 25 MG tablet   lisinopril (ZESTRIL) 30 MG tablet   Other Relevant Orders   CBC with Differential/Platelet   CMP14+EGFR   Lipid panel   TSH   Bayer DCA Hb A1c Waived   Prediabetes   Relevant Orders   CBC with Differential/Platelet   CMP14+EGFR   Lipid panel   TSH   Bayer DCA Hb A1c Waived    A1c 6.0.  Blood pressure  is in the 140s, allowing for permissive hypertension for age.  Recommended weightbearing exercise and she will consider scheduling for the bone density scan. Follow up plan: Return in about 6 months (around 02/09/2024), or if symptoms worsen or fail to improve, for Prediabetes hypertension and cholesterol.  Counseling provided for all of the vaccine components Orders Placed This Encounter  Procedures   CBC with Differential/Platelet   CMP14+EGFR   Lipid panel   TSH   Bayer DCA Hb A1c Waived    Arville Care, MD [facility] 08/09/2023, 8:32 AM

## 2023-08-10 ENCOUNTER — Encounter: Payer: Self-pay | Admitting: Family Medicine

## 2023-08-10 ENCOUNTER — Ambulatory Visit (INDEPENDENT_AMBULATORY_CARE_PROVIDER_SITE_OTHER)

## 2023-08-10 ENCOUNTER — Other Ambulatory Visit: Payer: Self-pay | Admitting: Family Medicine

## 2023-08-10 DIAGNOSIS — Z780 Asymptomatic menopausal state: Secondary | ICD-10-CM

## 2023-08-10 LAB — CMP14+EGFR
ALT: 12 IU/L (ref 0–32)
AST: 24 IU/L (ref 0–40)
Albumin: 4.5 g/dL (ref 3.8–4.8)
Alkaline Phosphatase: 101 IU/L (ref 44–121)
BUN/Creatinine Ratio: 17 (ref 12–28)
BUN: 27 mg/dL (ref 8–27)
Bilirubin Total: 0.6 mg/dL (ref 0.0–1.2)
CO2: 27 mmol/L (ref 20–29)
Calcium: 9.9 mg/dL (ref 8.7–10.3)
Chloride: 101 mmol/L (ref 96–106)
Creatinine, Ser: 1.56 mg/dL — ABNORMAL HIGH (ref 0.57–1.00)
Globulin, Total: 2.2 g/dL (ref 1.5–4.5)
Glucose: 102 mg/dL — ABNORMAL HIGH (ref 70–99)
Potassium: 4.1 mmol/L (ref 3.5–5.2)
Sodium: 140 mmol/L (ref 134–144)
Total Protein: 6.7 g/dL (ref 6.0–8.5)
eGFR: 34 mL/min/{1.73_m2} — ABNORMAL LOW (ref >59)

## 2023-08-10 LAB — CBC WITH DIFFERENTIAL/PLATELET
Basophils Absolute: 0.1 10*3/uL (ref 0.0–0.2)
Basos: 1 %
EOS (ABSOLUTE): 0.1 10*3/uL (ref 0.0–0.4)
Eos: 2 %
Hematocrit: 37.1 % (ref 34.0–46.6)
Hemoglobin: 12.4 g/dL (ref 11.1–15.9)
Immature Grans (Abs): 0 10*3/uL (ref 0.0–0.1)
Immature Granulocytes: 0 %
Lymphocytes Absolute: 1.7 10*3/uL (ref 0.7–3.1)
Lymphs: 36 %
MCH: 32 pg (ref 26.6–33.0)
MCHC: 33.4 g/dL (ref 31.5–35.7)
MCV: 96 fL (ref 79–97)
Monocytes Absolute: 0.5 10*3/uL (ref 0.1–0.9)
Monocytes: 10 %
Neutrophils Absolute: 2.4 10*3/uL (ref 1.4–7.0)
Neutrophils: 51 %
Platelets: 238 10*3/uL (ref 150–450)
RBC: 3.88 x10E6/uL (ref 3.77–5.28)
RDW: 12.2 % (ref 11.7–15.4)
WBC: 4.8 10*3/uL (ref 3.4–10.8)

## 2023-08-10 LAB — LIPID PANEL
Cholesterol, Total: 143 mg/dL (ref 100–199)
HDL: 64 mg/dL (ref >39)
LDL CALC COMMENT:: 2.2 ratio (ref 0.0–4.4)
LDL Chol Calc (NIH): 67 mg/dL (ref 0–99)
Triglycerides: 56 mg/dL (ref 0–149)
VLDL Cholesterol Cal: 12 mg/dL (ref 5–40)

## 2023-08-10 LAB — TSH: TSH: 2.01 u[IU]/mL (ref 0.450–4.500)

## 2023-08-11 DIAGNOSIS — Z780 Asymptomatic menopausal state: Secondary | ICD-10-CM | POA: Diagnosis not present

## 2023-08-11 DIAGNOSIS — M8589 Other specified disorders of bone density and structure, multiple sites: Secondary | ICD-10-CM | POA: Diagnosis not present

## 2023-08-14 ENCOUNTER — Encounter: Payer: Self-pay | Admitting: Family Medicine

## 2023-08-17 ENCOUNTER — Ambulatory Visit: Payer: Medicare Other

## 2023-08-17 VITALS — BP 144/71 | HR 60 | Ht 63.0 in | Wt 121.0 lb

## 2023-08-17 DIAGNOSIS — Z0000 Encounter for general adult medical examination without abnormal findings: Secondary | ICD-10-CM
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
## 2023-08-17 NOTE — Patient Instructions (Signed)
 Ms. Pam Franklin , Thank you for taking time to come for your Medicare Wellness Visit. I appreciate your ongoing commitment to your health goals. Please review the following plan we discussed and let me know if I can assist you in the future.   Referrals/Orders/Follow-Ups/Clinician Recommendations: Please don't forget to go to the pharmacy and update your Tetanus Vaccine.   This is a list of the screening recommended for you and due dates:  Health Maintenance  Topic Date Due   COVID-19 Vaccine (5 - 2024-25 season) 01/29/2023   DTaP/Tdap/Td vaccine (2 - Td or Tdap) 03/01/2023   Medicare Annual Wellness Visit  08/16/2024   DEXA scan (bone density measurement)  08/10/2025   Colon Cancer Screening  09/12/2029   Pneumonia Vaccine  Completed   Flu Shot  Completed   Hepatitis C Screening  Completed   Zoster (Shingles) Vaccine  Completed   HPV Vaccine  Aged Out    Advanced directives: (Declined) Advance directive discussed with you today. Even though you declined this today, please call our office should you change your mind, and we can give you the proper paperwork for you to fill out.  Next Medicare Annual Wellness Visit scheduled for next year: Yes

## 2023-09-09 ENCOUNTER — Other Ambulatory Visit (HOSPITAL_COMMUNITY): Payer: Self-pay

## 2023-09-14 DIAGNOSIS — N952 Postmenopausal atrophic vaginitis: Secondary | ICD-10-CM | POA: Diagnosis not present

## 2023-09-14 DIAGNOSIS — Z79890 Hormone replacement therapy (postmenopausal): Secondary | ICD-10-CM | POA: Diagnosis not present

## 2023-09-14 DIAGNOSIS — Z780 Asymptomatic menopausal state: Secondary | ICD-10-CM | POA: Diagnosis not present

## 2023-09-14 DIAGNOSIS — Z01419 Encounter for gynecological examination (general) (routine) without abnormal findings: Secondary | ICD-10-CM | POA: Diagnosis not present

## 2023-12-12 DIAGNOSIS — L821 Other seborrheic keratosis: Secondary | ICD-10-CM | POA: Diagnosis not present

## 2023-12-12 DIAGNOSIS — D692 Other nonthrombocytopenic purpura: Secondary | ICD-10-CM | POA: Diagnosis not present

## 2023-12-12 DIAGNOSIS — D1801 Hemangioma of skin and subcutaneous tissue: Secondary | ICD-10-CM | POA: Diagnosis not present

## 2023-12-12 DIAGNOSIS — L820 Inflamed seborrheic keratosis: Secondary | ICD-10-CM | POA: Diagnosis not present

## 2023-12-12 DIAGNOSIS — L718 Other rosacea: Secondary | ICD-10-CM | POA: Diagnosis not present

## 2023-12-12 DIAGNOSIS — D225 Melanocytic nevi of trunk: Secondary | ICD-10-CM | POA: Diagnosis not present

## 2023-12-12 DIAGNOSIS — L2989 Other pruritus: Secondary | ICD-10-CM | POA: Diagnosis not present

## 2023-12-12 DIAGNOSIS — Z85828 Personal history of other malignant neoplasm of skin: Secondary | ICD-10-CM | POA: Diagnosis not present

## 2023-12-12 DIAGNOSIS — L570 Actinic keratosis: Secondary | ICD-10-CM | POA: Diagnosis not present

## 2024-01-24 ENCOUNTER — Other Ambulatory Visit: Payer: Self-pay | Admitting: Family Medicine

## 2024-01-24 DIAGNOSIS — F419 Anxiety disorder, unspecified: Secondary | ICD-10-CM

## 2024-02-08 DIAGNOSIS — Z1231 Encounter for screening mammogram for malignant neoplasm of breast: Secondary | ICD-10-CM | POA: Diagnosis not present

## 2024-02-08 LAB — HM MAMMOGRAPHY

## 2024-02-09 ENCOUNTER — Ambulatory Visit: Admitting: Family Medicine

## 2024-02-09 ENCOUNTER — Encounter: Payer: Self-pay | Admitting: Family Medicine

## 2024-02-09 VITALS — BP 146/62 | HR 58 | Ht 63.0 in | Wt 120.0 lb

## 2024-02-09 DIAGNOSIS — I10 Essential (primary) hypertension: Secondary | ICD-10-CM

## 2024-02-09 DIAGNOSIS — Z23 Encounter for immunization: Secondary | ICD-10-CM | POA: Diagnosis not present

## 2024-02-09 DIAGNOSIS — M85859 Other specified disorders of bone density and structure, unspecified thigh: Secondary | ICD-10-CM

## 2024-02-09 DIAGNOSIS — F419 Anxiety disorder, unspecified: Secondary | ICD-10-CM | POA: Diagnosis not present

## 2024-02-09 DIAGNOSIS — R7303 Prediabetes: Secondary | ICD-10-CM | POA: Diagnosis not present

## 2024-02-09 DIAGNOSIS — F32A Depression, unspecified: Secondary | ICD-10-CM | POA: Diagnosis not present

## 2024-02-09 DIAGNOSIS — E7800 Pure hypercholesterolemia, unspecified: Secondary | ICD-10-CM

## 2024-02-09 LAB — LIPID PANEL

## 2024-02-09 LAB — BAYER DCA HB A1C WAIVED: HB A1C (BAYER DCA - WAIVED): 5.7 % — ABNORMAL HIGH (ref 4.8–5.6)

## 2024-02-09 MED ORDER — ESCITALOPRAM OXALATE 20 MG PO TABS: 20.0000 mg | ORAL_TABLET | Freq: Every day | ORAL | 1 refills | Status: AC

## 2024-02-09 NOTE — Progress Notes (Signed)
 BP (!) 146/62   Pulse (!) 58   Ht 5' 3 (1.6 m)   Wt 120 lb (54.4 kg)   LMP  (LMP Unknown)   SpO2 98%   BMI 21.26 kg/m    Subjective:   Patient ID: Pam Franklin, female    DOB: 03-22-1951, 73 y.o.   MRN: [id number]  HPI: Pam Franklin is a 73 y.o. female presenting on 02/09/2024 for Medical Management of Chronic Issues, Hypertension, and Prediabetes   Discussed the use of AI scribe software for clinical note transcription with the patient, who gave verbal consent to proceed.  History of Present Illness   Pam Franklin is a 73 year old female with hypertension and hyperlipidemia who presents for a follow-up visit.  Her blood pressure at home usually ranges from 125 to 130 mmHg, which is lower than the readings taken in the office. She monitors her blood pressure regularly and is currently taking lisinopril  and hydrochlorothiazide  without any side effects, although she has a persistent cough since having COVID-19.  She has been experiencing a persistent cough that worsens when she lies down at night. She also has sinus drainage and a runny nose, which exacerbates when she eats. She uses Flonase  every night to manage these symptoms.  She continues to take atorvastatin  for cholesterol management and a baby aspirin daily.     Relevant past medical, surgical, family and social history reviewed and updated as indicated. Interim medical history since our last visit reviewed. Allergies and medications reviewed and updated.  Review of Systems  Constitutional:  Negative for chills and fever.  HENT:  Negative for congestion, ear discharge and ear pain.   Eyes:  Negative for redness and visual disturbance.  Respiratory:  Negative for chest tightness and shortness of breath.   Cardiovascular:  Negative for chest pain and leg swelling.  Genitourinary:  Negative for difficulty urinating and dysuria.  Musculoskeletal:  Positive for arthralgias (Both knees at times). Negative for back pain  and gait problem.  Skin:  Negative for rash.  Neurological:  Negative for light-headedness and headaches.  Psychiatric/Behavioral:  Negative for agitation and behavioral problems.   All other systems reviewed and are negative.   Per HPI unless specifically indicated above   Allergies as of 02/09/2024       Reactions   Codeine Other (See Comments)   Makes her irritable   Nitrofurantoin Nausea And Vomiting   REACTION: nausea and vomiting   Prednisone  Nausea Only        Medication List        Accurate as of February 09, 2024  9:37 AM. If you have any questions, ask your nurse or doctor.          aspirin 81 MG tablet Take 81 mg by mouth daily.   atorvastatin  20 MG tablet Commonly known as: LIPITOR Take 1 tablet (20 mg total) by mouth daily.   azelaic acid 20 % cream Commonly known as: AZELEX Apply topically 2 (two) times daily. After skin is thoroughly washed and patted dry, gently but thoroughly massage a thin film of azelaic acid cream into the affected area twice daily, in the morning and evening.   Biotin 5 MG Caps Take 1 capsule by mouth daily.   Cetirizine HCl 10 MG Caps Take 10 mg by mouth daily.   Cinnamon 500 MG capsule Take 500 mg by mouth daily.   cycloSPORINE 0.05 % ophthalmic emulsion Commonly known as: RESTASIS 1 drop 2 (two) times  daily.   escitalopram  20 MG tablet Commonly known as: LEXAPRO  Take 1 tablet (20 mg total) by mouth daily.   estradiol 0.025 MG/24HR Commonly known as: VIVELLE-DOT Place 1 patch onto the skin 2 (two) times a week.   fluticasone  50 MCG/ACT nasal spray Commonly known as: FLONASE  Place 1 spray into both nostrils 2 (two) times daily as needed for allergies or rhinitis.   hydrochlorothiazide  25 MG tablet Commonly known as: HYDRODIURIL  Take 1 tablet (25 mg total) by mouth daily.   L-Lysine 1000 MG Tabs Take 1 tablet by mouth daily.   lisinopril  30 MG tablet Commonly known as: ZESTRIL  Take 1 tablet (30 mg  total) by mouth daily.   Magnesium 250 MG Tabs Take 1 tablet by mouth daily.   Oyster Shell Calcium  w/D 500-5 MG-MCG Tabs TAKE 1 TABLET BY MOUTH EVERY DAY WITH BREAKFAST         Objective:   BP (!) 146/62   Pulse (!) 58   Ht 5' 3 (1.6 m)   Wt 120 lb (54.4 kg)   LMP  (LMP Unknown)   SpO2 98%   BMI 21.26 kg/m   Wt Readings from Last 3 Encounters:  02/09/24 120 lb (54.4 kg)  08/17/23 121 lb (54.9 kg)  08/09/23 121 lb (54.9 kg)    Physical Exam Physical Exam   NECK: Thyroid  without nodules or enlargement. CHEST: Lungs clear to auscultation bilaterally. CARDIOVASCULAR: Heart regular rate and rhythm, no murmurs. EXTREMITIES: Extremities without edema, normal pulses.         Assessment & Plan:   Problem List Items Addressed This Visit       Cardiovascular and Mediastinum   Essential hypertension, benign   Relevant Orders   Bayer DCA Hb A1c Waived   CBC with Differential/Platelet   CMP14+EGFR   Lipid panel     Musculoskeletal and Integument   Osteopenia     Other   HLD (hyperlipidemia)   Relevant Orders   Bayer DCA Hb A1c Waived   CBC with Differential/Platelet   CMP14+EGFR   Lipid panel   Prediabetes - Primary   Relevant Orders   Bayer DCA Hb A1c Waived   CBC with Differential/Platelet   CMP14+EGFR   Lipid panel   Anxiety and depression   Relevant Medications   escitalopram  (LEXAPRO ) 20 MG tablet      Essential hypertension Blood pressure well-controlled with lisinopril  and hydrochlorothiazide . Persistent cough likely from past COVID-19 infection. - Continue lisinopril  and hydrochlorothiazide . - Monitor blood pressure at home. - Report if blood pressure increases.  Pure hypercholesterolemia Continues atorvastatin . Cholesterol levels pending upcoming blood work. - Continue atorvastatin . - Order blood work to check cholesterol levels.  Prediabetes A1c to be assessed in upcoming blood work. - Order blood work to check A1c.  Allergic  rhinitis with postnasal drainage Persistent nasal drainage managed with Flonase  and allergy pill. - Continue Flonase  and allergy pill.  Fatigue Advised vitamin B12 supplementation. - Consider oral vitamin B12 supplementation.          Follow up plan: Return in about 6 months (around 08/08/2024), or if symptoms worsen or fail to improve, for Physical exam and recheck hypertension and hyperlipidemia.  Counseling provided for all of the vaccine components Orders Placed This Encounter  Procedures   Bayer DCA Hb A1c Waived   CBC with Differential/Platelet   CMP14+EGFR   Lipid panel    Fonda Levins, MD [facility] 02/09/2024, 9:37 AM

## 2024-02-10 LAB — CBC WITH DIFFERENTIAL/PLATELET
Basophils Absolute: 0.1 x10E3/uL (ref 0.0–0.2)
Basos: 1 %
EOS (ABSOLUTE): 0.1 x10E3/uL (ref 0.0–0.4)
Eos: 3 %
Hematocrit: 34.6 % (ref 34.0–46.6)
Hemoglobin: 11.5 g/dL (ref 11.1–15.9)
Immature Grans (Abs): 0 x10E3/uL (ref 0.0–0.1)
Immature Granulocytes: 0 %
Lymphocytes Absolute: 1.4 x10E3/uL (ref 0.7–3.1)
Lymphs: 30 %
MCH: 31.8 pg (ref 26.6–33.0)
MCHC: 33.2 g/dL (ref 31.5–35.7)
MCV: 96 fL (ref 79–97)
Monocytes Absolute: 0.4 x10E3/uL (ref 0.1–0.9)
Monocytes: 9 %
Neutrophils Absolute: 2.7 x10E3/uL (ref 1.4–7.0)
Neutrophils: 57 %
Platelets: 227 x10E3/uL (ref 150–450)
RBC: 3.62 x10E6/uL — ABNORMAL LOW (ref 3.77–5.28)
RDW: 12.3 % (ref 11.7–15.4)
WBC: 4.8 x10E3/uL (ref 3.4–10.8)

## 2024-02-10 LAB — CMP14+EGFR
ALT: 12 IU/L (ref 0–32)
AST: 20 IU/L (ref 0–40)
Albumin: 4.2 g/dL (ref 3.8–4.8)
Alkaline Phosphatase: 91 IU/L (ref 44–121)
BUN/Creatinine Ratio: 16 (ref 12–28)
BUN: 25 mg/dL (ref 8–27)
Bilirubin Total: 0.7 mg/dL (ref 0.0–1.2)
CO2: 25 mmol/L (ref 20–29)
Calcium: 9.8 mg/dL (ref 8.7–10.3)
Chloride: 104 mmol/L (ref 96–106)
Creatinine, Ser: 1.54 mg/dL — AB (ref 0.57–1.00)
Globulin, Total: 2.1 g/dL (ref 1.5–4.5)
Glucose: 100 mg/dL — AB (ref 70–99)
Potassium: 4.6 mmol/L (ref 3.5–5.2)
Sodium: 141 mmol/L (ref 134–144)
Total Protein: 6.3 g/dL (ref 6.0–8.5)
eGFR: 35 mL/min/1.73 — AB (ref >59)

## 2024-02-10 LAB — LIPID PANEL
Cholesterol, Total: 139 mg/dL (ref 100–199)
HDL: 66 mg/dL (ref >39)
LDL CALC COMMENT:: 2.1 ratio (ref 0.0–4.4)
LDL Chol Calc (NIH): 62 mg/dL (ref 0–99)
Triglycerides: 48 mg/dL (ref 0–149)
VLDL Cholesterol Cal: 11 mg/dL (ref 5–40)

## 2024-02-15 ENCOUNTER — Ambulatory Visit: Payer: Self-pay | Admitting: Family Medicine

## 2024-03-23 ENCOUNTER — Other Ambulatory Visit: Payer: Self-pay | Admitting: Family Medicine

## 2024-03-23 DIAGNOSIS — I10 Essential (primary) hypertension: Secondary | ICD-10-CM

## 2024-08-08 ENCOUNTER — Encounter: Payer: Self-pay | Admitting: Family Medicine
